# Patient Record
Sex: Female | Born: 1985 | Race: Black or African American | Hispanic: No | Marital: Single | Smoking: Never smoker
Health system: Southern US, Community
[De-identification: ages and names within clinical notes are randomized; demographics above are authoritative.]

## PROBLEM LIST (undated history)

## (undated) DIAGNOSIS — N83202 Unspecified ovarian cyst, left side: Secondary | ICD-10-CM

## (undated) DIAGNOSIS — N920 Excessive and frequent menstruation with regular cycle: Secondary | ICD-10-CM

## (undated) DIAGNOSIS — N83201 Unspecified ovarian cyst, right side: Secondary | ICD-10-CM

## (undated) DIAGNOSIS — N390 Urinary tract infection, site not specified: Secondary | ICD-10-CM

## (undated) DIAGNOSIS — D649 Anemia, unspecified: Secondary | ICD-10-CM

## (undated) DIAGNOSIS — G43909 Migraine, unspecified, not intractable, without status migrainosus: Secondary | ICD-10-CM

## (undated) DIAGNOSIS — A599 Trichomoniasis, unspecified: Secondary | ICD-10-CM

## (undated) DIAGNOSIS — L309 Dermatitis, unspecified: Secondary | ICD-10-CM

## (undated) HISTORY — PX: TOOTH EXTRACTION: SUR596

## (undated) HISTORY — PX: INDUCED ABORTION: SHX677

---
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
## 2003-03-05 ENCOUNTER — Emergency Department (HOSPITAL_COMMUNITY): Admission: AD | Admit: 2003-03-05 | Discharge: 2003-03-05 | Payer: Self-pay | Admitting: Family Medicine

## 2003-03-24 ENCOUNTER — Emergency Department (HOSPITAL_COMMUNITY): Admission: AD | Admit: 2003-03-24 | Discharge: 2003-03-24 | Payer: Self-pay | Admitting: Family Medicine

## 2004-03-01 ENCOUNTER — Emergency Department (HOSPITAL_COMMUNITY): Admission: EM | Admit: 2004-03-01 | Discharge: 2004-03-02 | Payer: Self-pay | Admitting: Emergency Medicine

## 2007-04-20 ENCOUNTER — Inpatient Hospital Stay (HOSPITAL_COMMUNITY): Admission: AD | Admit: 2007-04-20 | Discharge: 2007-04-22 | Payer: Self-pay | Admitting: Obstetrics & Gynecology

## 2010-02-03 ENCOUNTER — Emergency Department (HOSPITAL_COMMUNITY): Admission: EM | Admit: 2010-02-03 | Discharge: 2010-02-04 | Payer: Self-pay | Admitting: Emergency Medicine

## 2010-06-05 ENCOUNTER — Emergency Department (HOSPITAL_COMMUNITY)
Admission: EM | Admit: 2010-06-05 | Discharge: 2010-06-05 | Disposition: A | Discharge status: Home / Self Care | Payer: Medicaid Other | Attending: Emergency Medicine | Admitting: Emergency Medicine

## 2010-06-05 DIAGNOSIS — H5789 Other specified disorders of eye and adnexa: Secondary | ICD-10-CM | POA: Insufficient documentation

## 2010-06-05 DIAGNOSIS — H0019 Chalazion unspecified eye, unspecified eyelid: Secondary | ICD-10-CM | POA: Insufficient documentation

## 2010-07-06 LAB — URINALYSIS, ROUTINE W REFLEX MICROSCOPIC
Glucose, UA: NEGATIVE mg/dL
Hgb urine dipstick: NEGATIVE
Nitrite: NEGATIVE
Protein, ur: NEGATIVE mg/dL
Specific Gravity, Urine: 1.033 — ABNORMAL HIGH (ref 1.005–1.030)
Urobilinogen, UA: 1 mg/dL (ref 0.0–1.0)
pH: 7 (ref 5.0–8.0)

## 2010-07-06 LAB — URINE MICROSCOPIC-ADD ON

## 2010-07-06 LAB — GC/CHLAMYDIA PROBE AMP, GENITAL: GC Probe Amp, Genital: NEGATIVE

## 2010-07-06 LAB — POCT PREGNANCY, URINE: Preg Test, Ur: NEGATIVE

## 2010-07-06 LAB — WET PREP, GENITAL: Yeast Wet Prep HPF POC: NONE SEEN

## 2010-11-30 ENCOUNTER — Emergency Department (HOSPITAL_COMMUNITY)
Admission: EM | Admit: 2010-11-30 | Discharge: 2010-11-30 | Disposition: A | Discharge status: Home / Self Care | Payer: Medicaid Other | Attending: Emergency Medicine | Admitting: Emergency Medicine

## 2010-11-30 DIAGNOSIS — M549 Dorsalgia, unspecified: Secondary | ICD-10-CM | POA: Insufficient documentation

## 2010-11-30 DIAGNOSIS — N390 Urinary tract infection, site not specified: Secondary | ICD-10-CM | POA: Insufficient documentation

## 2010-11-30 DIAGNOSIS — R300 Dysuria: Secondary | ICD-10-CM | POA: Insufficient documentation

## 2010-11-30 DIAGNOSIS — R10819 Abdominal tenderness, unspecified site: Secondary | ICD-10-CM | POA: Insufficient documentation

## 2010-11-30 DIAGNOSIS — R350 Frequency of micturition: Secondary | ICD-10-CM | POA: Insufficient documentation

## 2010-11-30 LAB — URINE MICROSCOPIC-ADD ON

## 2010-11-30 LAB — URINALYSIS, ROUTINE W REFLEX MICROSCOPIC
Glucose, UA: NEGATIVE mg/dL
Protein, ur: NEGATIVE mg/dL
Specific Gravity, Urine: 1.017 (ref 1.005–1.030)
pH: 6 (ref 5.0–8.0)

## 2010-12-29 ENCOUNTER — Other Ambulatory Visit: Payer: Self-pay | Admitting: Obstetrics

## 2010-12-29 DIAGNOSIS — N63 Unspecified lump in unspecified breast: Secondary | ICD-10-CM

## 2010-12-31 ENCOUNTER — Inpatient Hospital Stay (HOSPITAL_COMMUNITY)
Admission: AD | Admit: 2010-12-31 | Discharge: 2011-01-01 | Disposition: A | Discharge status: Home / Self Care | Payer: Medicaid Other | Source: Ambulatory Visit | Attending: Obstetrics & Gynecology | Admitting: Obstetrics & Gynecology

## 2010-12-31 DIAGNOSIS — R300 Dysuria: Secondary | ICD-10-CM | POA: Insufficient documentation

## 2010-12-31 HISTORY — DX: Anemia, unspecified: D64.9

## 2011-01-01 ENCOUNTER — Encounter (HOSPITAL_COMMUNITY): Payer: Self-pay | Admitting: *Deleted

## 2011-01-01 LAB — URINALYSIS, ROUTINE W REFLEX MICROSCOPIC
Bilirubin Urine: NEGATIVE
Hgb urine dipstick: NEGATIVE
Specific Gravity, Urine: >1.03 — ABNORMAL HIGH (ref 1.005–1.030)
Urobilinogen, UA: 1 mg/dL (ref 0.0–1.0)

## 2011-01-01 MED ORDER — PHENAZOPYRIDINE HCL 200 MG PO TABS: 200.0000 mg | ORAL_TABLET | Freq: Three times a day (TID) | ORAL | Status: AC | PRN

## 2011-01-01 MED ORDER — PHENAZOPYRIDINE HCL 100 MG PO TABS
200.0000 mg | ORAL_TABLET | Freq: Once | ORAL | Status: AC
  Administered 2011-01-01: 200 mg via ORAL
  Filled 2011-01-01: qty 2

## 2011-01-01 NOTE — Progress Notes (Signed)
When voids has bladder cramping that is level 7 with pain

## 2011-01-01 NOTE — ED Notes (Signed)
Magda Kiel CNM in to see pt

## 2011-01-01 NOTE — Progress Notes (Signed)
Written and verbal d/c instructions given and understanding voiced. 

## 2011-01-01 NOTE — Progress Notes (Signed)
G6P1 TABx 5 LMP 12/04/10. Treated for uti at [facility] about 1 month ago. Felt better for awhile but now having same symptoms. Frequent urination with severe cramping right after voids. Voids small amts. R flank pain and nausea. Late with period and wondering if could be pregnant.

## 2011-01-01 NOTE — ED Provider Notes (Signed)
Chief Complaint:  Abdominal Pain and Dysuria   Chelsea Reed is  25 y.o. [phone].  Patient's last menstrual period was 12/04/2010.Marland Kitchen  Her pregnancy status is negative.  She presents complaining of Abdominal Pain and Dysuria . Onset is described as gradual and has been present for  1 days. Reports lower abd cramping with urination, frequency, oliguria, and right-sided low back pain. Denies fever, chills, nausea, vomiting, vag blding or discharge  Obstetrical/Gynecological History: OB History    Grav Para Term Preterm Abortions TAB SAB Ect Mult Living   6 1 1  0 5 5 0 0 0 1      Past Medical History: Past Medical History  Diagnosis Date  . Anemia     Past Surgical History: Past Surgical History  Procedure Date  . Induced abortion     TABs x 5    Family History: No family history on file.  Social History: History  Substance Use Topics  . Smoking status: Never Smoker   . Smokeless tobacco: Not on file  . Alcohol Use: Yes     social drinker    Allergies: No Known Allergies  Prescriptions prior to admission  Medication Sig Dispense Refill  . ibuprofen (ADVIL,MOTRIN) 200 MG tablet Take 200 mg by mouth every 6 (six) hours as needed. For menstrual pain and cramps       . Multiple Vitamin (MULTIVITAMIN PO) Take 1 tablet by mouth daily.          Review of Systems - Negative except what has been reviewed in the HPI  Physical Exam   Blood pressure 126/79, pulse 71, temperature 98.7 F (37.1 C), resp. rate 20, height 5\' 5"  (1.651 m), weight 66.588 kg (146 lb 12.8 oz), last menstrual period 12/04/2010.  General: General appearance - alert, well appearing, and in no distress, oriented to person, place, and time and overweight Mental status - normal mood, behavior, speech, dress, motor activity, and thought processes, affect appropriate to mood Lymphatics - no palpable lymphadenopathy, no hepatosplenomegaly Abdomen - soft, nontender, nondistended, no masses or organomegaly no  rebound tenderness noted bowel sounds normal no bladder distension noted no CVA tenderness Focused Gynecological Exam: examination not indicated  Labs: Recent Results (from the past 24 hour(s))  URINALYSIS, ROUTINE W REFLEX MICROSCOPIC   Collection Time   01/01/11 12:50 AM      Component Value Range   Color, Urine YELLOW  YELLOW    Appearance CLEAR  CLEAR    Specific Gravity, Urine >1.030 (*) 1.005 - 1.030    pH 6.0  5.0 - 8.0    Glucose, UA NEGATIVE  NEGATIVE (mg/dL)   Hgb urine dipstick NEGATIVE  NEGATIVE    Bilirubin Urine NEGATIVE  NEGATIVE    Ketones, ur NEGATIVE  NEGATIVE (mg/dL)   Protein, ur NEGATIVE  NEGATIVE (mg/dL)   Urobilinogen, UA 1.0  0.0 - 1.0 (mg/dL)   Nitrite NEGATIVE  NEGATIVE    Leukocytes, UA NEGATIVE  NEGATIVE   POCT PREGNANCY, URINE   Collection Time   01/01/11  1:00 AM      Component Value Range   Preg Test, Ur NEGATIVE      Assessment: Dysuria  Plan: Discharge home Urine Culture Sent Rx pyridium prn  Dionisio Aragones E. 01/01/2011,1:33 AM

## 2011-01-01 NOTE — ED Notes (Signed)
[address] CNM in to discuss u/a results and plan of care for d/c home

## 2011-01-05 ENCOUNTER — Ambulatory Visit
Admission: RE | Admit: 2011-01-05 | Discharge: 2011-01-05 | Disposition: A | Discharge status: Home / Self Care | Payer: Medicaid Other | Source: Ambulatory Visit | Attending: Obstetrics | Admitting: Obstetrics

## 2011-01-05 DIAGNOSIS — N63 Unspecified lump in unspecified breast: Secondary | ICD-10-CM

## 2011-01-27 LAB — CBC
HCT: 35 — ABNORMAL LOW
MCV: 89.7
MCV: 89.8
Platelets: 184
Platelets: 188
RBC: 3.28 — ABNORMAL LOW
RBC: 3.9
WBC: 10.9 — ABNORMAL HIGH
WBC: 13.1 — ABNORMAL HIGH

## 2011-01-27 LAB — RPR: RPR Ser Ql: NONREACTIVE

## 2011-04-08 ENCOUNTER — Encounter (HOSPITAL_COMMUNITY): Payer: Self-pay | Admitting: *Deleted

## 2011-04-08 ENCOUNTER — Inpatient Hospital Stay (HOSPITAL_COMMUNITY)
Admission: AD | Admit: 2011-04-08 | Discharge: 2011-04-08 | Disposition: A | Discharge status: Home / Self Care | Payer: Medicaid Other | Source: Ambulatory Visit | Attending: Obstetrics | Admitting: Obstetrics

## 2011-04-08 DIAGNOSIS — N949 Unspecified condition associated with female genital organs and menstrual cycle: Secondary | ICD-10-CM | POA: Insufficient documentation

## 2011-04-08 DIAGNOSIS — N938 Other specified abnormal uterine and vaginal bleeding: Secondary | ICD-10-CM | POA: Insufficient documentation

## 2011-04-08 DIAGNOSIS — Z3202 Encounter for pregnancy test, result negative: Secondary | ICD-10-CM

## 2011-04-08 LAB — HCG, SERUM, QUALITATIVE: Preg, Serum: NEGATIVE

## 2011-04-08 LAB — URINALYSIS, ROUTINE W REFLEX MICROSCOPIC
Glucose, UA: NEGATIVE mg/dL
Ketones, ur: NEGATIVE mg/dL
Leukocytes, UA: NEGATIVE
Nitrite: NEGATIVE
Specific Gravity, Urine: >1.03 — ABNORMAL HIGH (ref 1.005–1.030)
pH: 5.5 (ref 5.0–8.0)

## 2011-04-08 LAB — URINE MICROSCOPIC-ADD ON

## 2011-04-08 LAB — POCT PREGNANCY, URINE: Preg Test, Ur: NEGATIVE

## 2011-04-08 NOTE — ED Provider Notes (Signed)
History     Chief Complaint  Patient presents with  . Vaginal Bleeding   HPI 25 y.o. [id number] with light bleeding, states she expected her period to come last week, took an HPT, faint positive, had another faint positive on 2 days ago. No pain. Patient's last menstrual period was 03/02/2011.    Past Medical History  Diagnosis Date  . Anemia     Past Surgical History  Procedure Date  . Induced abortion     TABs x 5    History reviewed. No pertinent family history.  History  Substance Use Topics  . Smoking status: Never Smoker   . Smokeless tobacco: Not on file  . Alcohol Use: Yes     social drinker    Allergies: No Known Allergies  No prescriptions prior to admission    Review of Systems  Constitutional: Negative.   Respiratory: Negative.   Cardiovascular: Negative.   Gastrointestinal: Negative for nausea, vomiting, abdominal pain, diarrhea and constipation.  Genitourinary: Negative for dysuria, urgency, frequency, hematuria and flank pain.       Positive for vaginal bleeding   Musculoskeletal: Negative.   Neurological: Negative.   Psychiatric/Behavioral: Negative.    Physical Exam   Blood pressure 124/70, pulse 70, temperature 98.9 F (37.2 C), temperature source Oral, resp. rate 20, height 5\' 4"  (1.626 m), weight 146 lb 6.4 oz (66.407 kg), last menstrual period 03/02/2011.  Physical Exam  Nursing note and vitals reviewed. Constitutional: She is oriented to person, place, and time. She appears well-developed and well-nourished. No distress.  Cardiovascular: Normal rate.   Respiratory: Effort normal.  GI: Soft. There is no tenderness.  Musculoskeletal: Normal range of motion.  Neurological: She is alert and oriented to person, place, and time.  Skin: Skin is warm and dry.  Psychiatric: She has a normal mood and affect.    MAU Course  Procedures  Results for orders placed during the hospital encounter of 04/08/11 (from the past 24 hour(s))    URINALYSIS, ROUTINE W REFLEX MICROSCOPIC     Status: Abnormal   Collection Time   04/08/11 11:50 AM      Component Value Range   Color, Urine YELLOW  YELLOW    APPearance HAZY (*) CLEAR    Specific Gravity, Urine >1.030 (*) 1.005 - 1.030    pH 5.5  5.0 - 8.0    Glucose, UA NEGATIVE  NEGATIVE (mg/dL)   Hgb urine dipstick LARGE (*) NEGATIVE    Bilirubin Urine NEGATIVE  NEGATIVE    Ketones, ur NEGATIVE  NEGATIVE (mg/dL)   Protein, ur NEGATIVE  NEGATIVE (mg/dL)   Urobilinogen, UA 0.2  0.0 - 1.0 (mg/dL)   Nitrite NEGATIVE  NEGATIVE    Leukocytes, UA NEGATIVE  NEGATIVE   URINE MICROSCOPIC-ADD ON     Status: Abnormal   Collection Time   04/08/11 11:50 AM      Component Value Range   Squamous Epithelial / LPF FEW (*) RARE    WBC, UA 0-2  <3 (WBC/hpf)   RBC / HPF 21-50  <3 (RBC/hpf)   Bacteria, UA RARE  RARE   POCT PREGNANCY, URINE     Status: Normal   Collection Time   04/08/11 11:55 AM      Component Value Range   Preg Test, Ur NEGATIVE    HCG, SERUM, QUALITATIVE     Status: Normal   Collection Time   04/08/11 12:11 PM      Component Value Range   Preg, Serum NEGATIVE  NEGATIVE      Assessment and Plan  25 y.o. [id number] at with negative pregnancy test, bleeding likely due to normal menstruation, follow up PRN  Chanay Nugent 04/08/2011, 2:50 PM

## 2011-04-08 NOTE — Progress Notes (Signed)
Pt reports having +HPT started having bleeding and spotting since Thursday. Pt feels weak and nauseated.

## 2011-07-03 ENCOUNTER — Inpatient Hospital Stay (HOSPITAL_COMMUNITY)
Admission: AD | Admit: 2011-07-03 | Discharge: 2011-07-03 | Disposition: A | Discharge status: Home / Self Care | Payer: Medicaid Other | Source: Ambulatory Visit | Attending: Obstetrics | Admitting: Obstetrics

## 2011-07-03 ENCOUNTER — Encounter (HOSPITAL_COMMUNITY): Payer: Self-pay | Admitting: *Deleted

## 2011-07-03 DIAGNOSIS — R300 Dysuria: Secondary | ICD-10-CM

## 2011-07-03 HISTORY — DX: Trichomoniasis, unspecified: A59.9

## 2011-07-03 HISTORY — DX: Urinary tract infection, site not specified: N39.0

## 2011-07-03 HISTORY — DX: Unspecified ovarian cyst, left side: N83.202

## 2011-07-03 HISTORY — DX: Unspecified ovarian cyst, right side: N83.201

## 2011-07-03 HISTORY — DX: Excessive and frequent menstruation with regular cycle: N92.0

## 2011-07-03 LAB — URINE MICROSCOPIC-ADD ON

## 2011-07-03 LAB — POCT PREGNANCY, URINE: Preg Test, Ur: NEGATIVE

## 2011-07-03 LAB — URINALYSIS, ROUTINE W REFLEX MICROSCOPIC
Glucose, UA: NEGATIVE mg/dL
Ketones, ur: NEGATIVE mg/dL
Protein, ur: NEGATIVE mg/dL
pH: 6 (ref 5.0–8.0)

## 2011-07-03 MED ORDER — CIPROFLOXACIN HCL 250 MG PO TABS: 250.0000 mg | ORAL_TABLET | Freq: Two times a day (BID) | ORAL | Status: AC

## 2011-07-03 NOTE — MAU Note (Signed)
Patient states she has a history of a severe UTI about one year ago. Started having the same symptoms about one week ago, pain and burning and frequency with urination. Started having left flank pain last night.

## 2011-07-03 NOTE — MAU Provider Note (Signed)
History     Chief Complaint  Patient presents with  . Dysuria   HPI Chelsea Reed is 26 y.o. [id number] presents with symptoms of dysuria for 7 days.  Bought cranberry pills which has helped her sxs.   Menses began 6 days ago.  Continues forcing po fluids.  Continues with strong urinary odor, frequency.  Began yesterday with left lower back pain.  Hx of "severe" UTI one year and feels the same sxs.  Didn't want it to get bad.  Denies fever, chills.   Called Dr. Elsie Reed office 1 week ago and made appt for this Wednesday.  Wants to keep that appointment with him to have STD check, ? Pap smear and follow up for UTI.  Is at the end of her period now.     Past Medical History  Diagnosis Date  . Anemia   . Heavy periods   . Bilateral ovarian cysts     resolve without intervention  . Trichomonas   . Urinary tract infection     Past Surgical History  Procedure Date  . Induced abortion     TABs x 5  . Tooth extraction     History reviewed. No pertinent family history.  History  Substance Use Topics  . Smoking status: Never Smoker   . Smokeless tobacco: Never Used  . Alcohol Use: Yes     social drinker    Allergies: No Known Allergies  Prescriptions prior to admission  Medication Sig Dispense Refill  . CRANBERRY EXTRACT PO Take 1 tablet by mouth daily as needed. For uti symptoms        Review of Systems  Constitutional: Negative for fever, chills and malaise/fatigue.  Genitourinary: Positive for dysuria. Negative for urgency, frequency and hematuria.  Musculoskeletal: Positive for back pain (low).  Psychiatric/Behavioral: The patient is not nervous/anxious.    Physical Exam   Blood pressure 111/83, pulse 68, temperature 97.9 F (36.6 C), temperature source Oral, resp. rate 16, height 5' 3.5" (1.613 m), weight 64.955 kg (143 lb 3.2 oz), last menstrual period 06/27/2011, SpO2 100.00%.  Physical Exam  Constitutional: She is oriented to person, place, and time. She  appears well-developed and well-nourished. No distress.  HENT:  Head: Normocephalic.  Neck: Normal range of motion.  Cardiovascular: Normal rate.   Respiratory: Effort normal.  GI: There is no tenderness. There is no CVA tenderness.  Genitourinary:       Pelvic exam not done  Musculoskeletal:       Right shoulder: She exhibits tenderness (lower left).  Neurological: She is alert and oriented to person, place, and time.  Skin: Skin is warm and dry.  Psychiatric: She has a normal mood and affect. Her behavior is normal.   Results for orders placed during the hospital encounter of 07/03/11 (from the past 24 hour(s))  URINALYSIS, ROUTINE W REFLEX MICROSCOPIC     Status: Abnormal   Collection Time   07/03/11 11:15 AM      Component Value Range   Color, Urine YELLOW  YELLOW    APPearance HAZY (*) CLEAR    Specific Gravity, Urine 1.025  1.005 - 1.030    pH 6.0  5.0 - 8.0    Glucose, UA NEGATIVE  NEGATIVE (mg/dL)   Hgb urine dipstick LARGE (*) NEGATIVE    Bilirubin Urine NEGATIVE  NEGATIVE    Ketones, ur NEGATIVE  NEGATIVE (mg/dL)   Protein, ur NEGATIVE  NEGATIVE (mg/dL)   Urobilinogen, UA 0.2  0.0 - 1.0 (mg/dL)  Nitrite NEGATIVE  NEGATIVE    Leukocytes, UA MODERATE (*) NEGATIVE   URINE MICROSCOPIC-ADD ON     Status: Abnormal   Collection Time   07/03/11 11:15 AM      Component Value Range   Squamous Epithelial / LPF RARE  RARE    WBC, UA 11-20  <3 (WBC/hpf)   Bacteria, UA FEW (*) RARE    Urine-Other MUCOUS PRESENT    POCT PREGNANCY, URINE     Status: Normal   Collection Time   07/03/11 11:22 AM      Component Value Range   Preg Test, Ur NEGATIVE  NEGATIVE    MAU Course  Procedures    MDM   Assessment and Plan  A:  Dysuria       P:  Cipro 250mg  po bid X 3 days       Keep appt to see Dr. Gaynell Reed for 3/13      Continue po fluids.  Chelsea Reed,EVE M 07/03/2011, 11:41 AM

## 2011-07-19 ENCOUNTER — Other Ambulatory Visit: Payer: Self-pay | Admitting: Obstetrics

## 2011-07-19 DIAGNOSIS — N63 Unspecified lump in unspecified breast: Secondary | ICD-10-CM

## 2011-08-01 ENCOUNTER — Other Ambulatory Visit: Payer: Medicaid Other

## 2011-08-14 ENCOUNTER — Inpatient Hospital Stay: Admission: RE | Admit: 2011-08-14 | Payer: Medicaid Other | Source: Ambulatory Visit

## 2011-08-24 ENCOUNTER — Ambulatory Visit
Admission: RE | Admit: 2011-08-24 | Discharge: 2011-08-24 | Disposition: A | Discharge status: Home / Self Care | Payer: Medicaid Other | Source: Ambulatory Visit | Attending: Obstetrics | Admitting: Obstetrics

## 2011-08-24 DIAGNOSIS — N63 Unspecified lump in unspecified breast: Secondary | ICD-10-CM

## 2011-09-07 ENCOUNTER — Encounter (HOSPITAL_COMMUNITY): Payer: Self-pay | Admitting: *Deleted

## 2011-09-07 ENCOUNTER — Inpatient Hospital Stay (HOSPITAL_COMMUNITY)
Admission: AD | Admit: 2011-09-07 | Discharge: 2011-09-08 | Disposition: A | Discharge status: Home / Self Care | Payer: Medicaid Other | Source: Ambulatory Visit | Attending: Obstetrics | Admitting: Obstetrics

## 2011-09-07 DIAGNOSIS — N390 Urinary tract infection, site not specified: Secondary | ICD-10-CM

## 2011-09-07 DIAGNOSIS — N83209 Unspecified ovarian cyst, unspecified side: Secondary | ICD-10-CM

## 2011-09-07 DIAGNOSIS — N83202 Unspecified ovarian cyst, left side: Secondary | ICD-10-CM

## 2011-09-07 DIAGNOSIS — R1032 Left lower quadrant pain: Secondary | ICD-10-CM | POA: Insufficient documentation

## 2011-09-07 LAB — URINALYSIS, ROUTINE W REFLEX MICROSCOPIC
Bilirubin Urine: NEGATIVE
Glucose, UA: NEGATIVE mg/dL
Hgb urine dipstick: NEGATIVE
Ketones, ur: NEGATIVE mg/dL
Protein, ur: NEGATIVE mg/dL
Urobilinogen, UA: 0.2 mg/dL (ref 0.0–1.0)

## 2011-09-07 LAB — URINE MICROSCOPIC-ADD ON

## 2011-09-07 NOTE — MAU Note (Signed)
Pt LMP 08/22/2011, ago started having sharp LLQ pain, felt like needed to have a BM, passed small amt of stool, pain became more intense.  Pain has eased at this time.  Hx ovarian cyst.

## 2011-09-08 ENCOUNTER — Inpatient Hospital Stay (HOSPITAL_COMMUNITY): Payer: Medicaid Other

## 2011-09-08 LAB — CBC
HCT: 32.2 % — ABNORMAL LOW (ref 36.0–46.0)
MCV: 87.3 fL (ref 78.0–100.0)
RBC: 3.69 MIL/uL — ABNORMAL LOW (ref 3.87–5.11)
RDW: 14.5 % (ref 11.5–15.5)
WBC: 7.1 10*3/uL (ref 4.0–10.5)

## 2011-09-08 LAB — DIFFERENTIAL
Basophils Absolute: 0 10*3/uL (ref 0.0–0.1)
Eosinophils Relative: 3 % (ref 0–5)
Lymphocytes Relative: 31 % (ref 12–46)
Lymphs Abs: 2.2 10*3/uL (ref 0.7–4.0)
Monocytes Absolute: 0.6 10*3/uL (ref 0.1–1.0)
Neutro Abs: 4.1 10*3/uL (ref 1.7–7.7)

## 2011-09-08 LAB — POCT PREGNANCY, URINE: Preg Test, Ur: NEGATIVE

## 2011-09-08 MED ORDER — SULFAMETHOXAZOLE-TRIMETHOPRIM 800-160 MG PO TABS: 1.0000 | ORAL_TABLET | Freq: Two times a day (BID) | ORAL | Status: DC

## 2011-09-08 MED ORDER — KETOROLAC TROMETHAMINE 60 MG/2ML IM SOLN
60.0000 mg | Freq: Once | INTRAMUSCULAR | Status: AC
  Administered 2011-09-08: 60 mg via INTRAMUSCULAR
  Filled 2011-09-08: qty 2

## 2011-09-08 MED ORDER — SULFAMETHOXAZOLE-TRIMETHOPRIM 800-160 MG PO TABS: 1.0000 | ORAL_TABLET | Freq: Two times a day (BID) | ORAL | Status: AC

## 2011-09-08 NOTE — Discharge Instructions (Signed)
Ovarian Cyst The ovaries are small organs that are on each side of the uterus. The ovaries are the organs that produce the female hormones, estrogen and progesterone. An ovarian cyst is a sac filled with fluid that can vary in its size. It is normal for a small cyst to form in women who are in the childbearing age and who have menstrual periods. This type of cyst is called a follicle cyst that becomes an ovulation cyst (corpus luteum cyst) after it produces the women's egg. It later goes away on its own if the woman does not become pregnant. There are other kinds of ovarian cysts that may cause problems and may need to be treated. The most serious problem is a cyst with cancer. It should be noted that menopausal women who have an ovarian cyst are at a higher risk of it being a cancer cyst. They should be evaluated very quickly, thoroughly and followed closely. This is especially true in menopausal women because of the high rate of ovarian cancer in women in menopause. CAUSES AND TYPES OF OVARIAN CYSTS:  FUNCTIONAL CYST: The follicle/corpus luteum cyst is a functional cyst that occurs every month during ovulation with the menstrual cycle. They go away with the next menstrual cycle if the woman does not get pregnant. Usually, there are no symptoms with a functional cyst.   ENDOMETRIOMA CYST: This cyst develops from the lining of the uterus tissue. This cyst gets in or on the ovary. It grows every month from the bleeding during the menstrual period. It is also called a "chocolate cyst" because it becomes filled with blood that turns brown. This cyst can cause pain in the lower abdomen during intercourse and with your menstrual period.   CYSTADENOMA CYST: This cyst develops from the cells on the outside of the ovary. They usually are not cancerous. They can get very big and cause lower abdomen pain and pain with intercourse. This type of cyst can twist on itself, cut off its blood supply and cause severe pain.  It also can easily rupture and cause a lot of pain.   DERMOID CYST: This type of cyst is sometimes found in both ovaries. They are found to have different kinds of body tissue in the cyst. The tissue includes skin, teeth, hair, and/or cartilage. They usually do not have symptoms unless they get very big. Dermoid cysts are rarely cancerous.   POLYCYSTIC OVARY: This is a rare condition with hormone problems that produces many small cysts on both ovaries. The cysts are follicle-like cysts that never produce an egg and become a corpus luteum. It can cause an increase in body weight, infertility, acne, increase in body and facial hair and lack of menstrual periods or rare menstrual periods. Many women with this problem develop type 2 diabetes. The exact cause of this problem is unknown. A polycystic ovary is rarely cancerous.   THECA LUTEIN CYST: Occurs when too much hormone (human chorionic gonadotropin) is produced and over-stimulates the ovaries to produce an egg. They are frequently seen when doctors stimulate the ovaries for invitro-fertilization (test tube babies).   LUTEOMA CYST: This cyst is seen during pregnancy. Rarely it can cause an obstruction to the birth canal during labor and delivery. They usually go away after delivery.  SYMPTOMS   Pelvic pain or pressure.   Pain during sexual intercourse.   Increasing girth (swelling) of the abdomen.   Abnormal menstrual periods.   Increasing pain with menstrual periods.   You stop having   You stop having menstrual periods and you are not pregnant.  DIAGNOSIS   The diagnosis can be made during:   Routine or annual pelvic examination (common).   Ultrasound.   X-ray of the pelvis.   CT Scan.   MRI.   Blood tests.  TREATMENT    Treatment may only be to follow the cyst monthly for 2 to 3 months with your caregiver. Many go away on their own, especially functional cysts.   May be aspirated (drained) with a long needle with ultrasound, or by laparoscopy (inserting a tube into  the pelvis through a small incision).   The whole cyst can be removed by laparoscopy.   Sometimes the cyst may need to be removed through an incision in the lower abdomen.   Hormone treatment is sometimes used to help dissolve certain cysts.   Birth control pills are sometimes used to help dissolve certain cysts.  HOME CARE INSTRUCTIONS   Follow your caregiver's advice regarding:   Medicine.   Follow up visits to evaluate and treat the cyst.   You may need to come back or make an appointment with another caregiver, to find the exact cause of your cyst, if your caregiver is not a gynecologist.   Get your yearly and recommended pelvic examinations and Pap tests.   Let your caregiver know if you have had an ovarian cyst in the past.  SEEK MEDICAL CARE IF:    Your periods are late, irregular, they stop, or are painful.   Your stomach (abdomen) or pelvic pain does not go away.   Your stomach becomes larger or swollen.   You have pressure on your bladder or trouble emptying your bladder completely.   You have painful sexual intercourse.   You have feelings of fullness, pressure, or discomfort in your stomach.   You lose weight for no apparent reason.   You feel generally ill.   You become constipated.   You lose your appetite.   You develop acne.   You have an increase in body and facial hair.   You are gaining weight, without changing your exercise and eating habits.   You think you are pregnant.  SEEK IMMEDIATE MEDICAL CARE IF:    You have increasing abdominal pain.   You feel sick to your stomach (nausea) and/or vomit.   You develop a fever that comes on suddenly.   You develop abdominal pain during a bowel movement.   Your menstrual periods become heavier than usual.  Document Released: 04/10/2005 Document Revised: 03/30/2011 Document Reviewed: 02/11/2009  [employer] Patient Information 2012 [employer]    Urinary Tract Infection  Infections of the urinary tract can start in several  places. A bladder infection (cystitis), a kidney infection (pyelonephritis), and a prostate infection (prostatitis) are different types of urinary tract infections (UTIs). They usually get better if treated with medicines (antibiotics) that kill germs. Take all the medicine until it is gone. You or your child may feel better in a few days, but TAKE ALL MEDICINE or the infection may not respond and may become more difficult to treat.  HOME CARE INSTRUCTIONS    Drink enough water and fluids to keep the urine clear or pale yellow. Cranberry juice is especially recommended, in addition to large amounts of water.   Avoid caffeine, tea, and carbonated beverages. They tend to irritate the bladder.   Alcohol may irritate the prostate.   Only take over-the-counter or prescription medicines for pain, discomfort, or fever as directed   by your caregiver.  To prevent further infections:   Empty the bladder often. Avoid holding urine for long periods of time.   After a bowel movement, women should cleanse from front to back. Use each tissue only once.   Empty the bladder before and after sexual intercourse.  FINDING OUT THE RESULTS OF YOUR TEST  Not all test results are available during your visit. If your or your child's test results are not back during the visit, make an appointment with your caregiver to find out the results. Do not assume everything is normal if you have not heard from your caregiver or the medical facility. It is important for you to follow up on all test results.  SEEK MEDICAL CARE IF:    There is back pain.   Your baby is older than 3 months with a rectal temperature of 100.5 F (38.1 C) or higher for more than 1 day.   Your or your child's problems (symptoms) are no better in 3 days. Return sooner if you or your child is getting worse.  SEEK IMMEDIATE MEDICAL CARE IF:    There is severe back pain or lower abdominal pain.   You or your child develops chills.   You have a fever.   Your baby is  older than 3 months with a rectal temperature of 102 F (38.9 C) or higher.   Your baby is 3 months old or younger with a rectal temperature of 100.4 F (38 C) or higher.   There is nausea or vomiting.   There is continued burning or discomfort with urination.  MAKE SURE YOU:    Understand these instructions.   Will watch your condition.   Will get help right away if you are not doing well or get worse.  Document Released: 01/18/2005 Document Revised: 03/30/2011 Document Reviewed: 08/23/2006  [employer] Patient Information 2012 [employer]

## 2011-09-08 NOTE — ED Provider Notes (Signed)
Patient resting comfortably. Toradol helped with the pain.

## 2011-09-08 NOTE — MAU Note (Signed)
Pt return from U/s.

## 2011-09-08 NOTE — MAU Provider Note (Signed)
History     CSN: [id number]  Arrival date and time: 09/07/11 2307   None     Chief Complaint  Patient presents with  . Abdominal Pain   HPI Patient is a 26 y/o AAF [id number] with h/o ovarian cysts. BIBA after having cramping pain in the LLQ while lying on her bed around 2300 that has now resolved. States at the time she went to the bathroom because she felt like she needed to have a BM. Her stool came out in hard pellets.While straining during her BM she became sweaty and felt as if she was going to pass-out. Denies Vaginal bleeding or d/c, SOB, CP.  LMP 08/22/11. BM typically once a day but recently QOD. LOI was a cookie at 2300.  OB History    Grav Para Term Preterm Abortions TAB SAB Ect Mult Living   6 1 1  0 5 5 0 0 0 1      Past Medical History  Diagnosis Date  . Anemia   . Heavy periods   . Bilateral ovarian cysts     resolve without intervention  . Trichomonas   . Urinary tract infection     Past Surgical History  Procedure Date  . Induced abortion     TABs x 5  . Tooth extraction     History reviewed. No pertinent family history.  History  Substance Use Topics  . Smoking status: Never Smoker   . Smokeless tobacco: Never Used  . Alcohol Use: Yes     social drinker    Allergies: No Known Allergies  Prescriptions prior to admission  Medication Sig Dispense Refill  . Multiple Vitamin (MULITIVITAMIN WITH MINERALS) TABS Take 1 tablet by mouth every morning.        Review of Systems  Constitutional: Positive for diaphoresis. Negative for fever and chills.  Gastrointestinal: Positive for abdominal pain and constipation. Negative for blood in stool and melena.  Genitourinary: Negative for dysuria, urgency, frequency, hematuria and flank pain.  Neurological: Negative for loss of consciousness.   Physical Exam   Blood pressure 90/55, pulse 79, temperature 97.6 F (36.4 C), temperature source Oral, resp. rate 16, height 5\' 4"  (1.626 m), weight 65.772 kg (145  lb), last menstrual period 08/22/2011.  Physical Exam  Constitutional: She appears well-developed and well-nourished. No distress.  HENT:  Head: Normocephalic and atraumatic.  Cardiovascular: Normal rate, regular rhythm, normal heart sounds and intact distal pulses.  Exam reveals no gallop and no friction rub.   No murmur heard. Respiratory: Effort normal and breath sounds normal. No respiratory distress. She has no wheezes. She has no rales. She exhibits no tenderness.  GI: Soft. Normal appearance. She exhibits no shifting dullness, no distension, no pulsatile liver, no abdominal bruit, no ascites and no mass. Bowel sounds are increased. There is no hepatosplenomegaly, splenomegaly or hepatomegaly. There is tenderness in the left lower quadrant. There is no rebound, no guarding, no tenderness at McBurney's point and negative Murphy's sign. No hernia. Hernia confirmed negative in the ventral area.    Genitourinary: Rectum normal, vagina normal and uterus normal. Rectal exam shows no external hemorrhoid, no internal hemorrhoid, no fissure, no mass, no tenderness and anal tone normal. No labial fusion. There is no rash, tenderness, lesion or injury on the right labia. There is no rash, tenderness, lesion or injury on the left labia. Uterus is not enlarged. Cervix exhibits no motion tenderness, no discharge and no friability. Right adnexum displays mass. Left adnexum displays tenderness. Left  adnexum displays no mass and no fullness. No erythema or bleeding around the vagina. No foreign body around the vagina. No vaginal discharge found.       Mass- ovary?   Skin: She is not diaphoretic.   Results for orders placed during the hospital encounter of 09/07/11 (from the past 24 hour(s))  URINALYSIS, ROUTINE W REFLEX MICROSCOPIC     Status: Abnormal   Collection Time   09/07/11 11:20 PM      Component Value Range   Color, Urine YELLOW  YELLOW    APPearance HAZY (*) CLEAR    Specific Gravity, Urine  >1.030 (*) 1.005 - 1.030    pH 6.0  5.0 - 8.0    Glucose, UA NEGATIVE  NEGATIVE (mg/dL)   Hgb urine dipstick NEGATIVE  NEGATIVE    Bilirubin Urine NEGATIVE  NEGATIVE    Ketones, ur NEGATIVE  NEGATIVE (mg/dL)   Protein, ur NEGATIVE  NEGATIVE (mg/dL)   Urobilinogen, UA 0.2  0.0 - 1.0 (mg/dL)   Nitrite NEGATIVE  NEGATIVE    Leukocytes, UA SMALL (*) NEGATIVE   URINE MICROSCOPIC-ADD ON     Status: Abnormal   Collection Time   09/07/11 11:20 PM      Component Value Range   Squamous Epithelial / LPF MANY (*) RARE    WBC, UA 21-50  <3 (WBC/hpf)   Bacteria, UA MANY (*) RARE    Urine-Other MUCOUS PRESENT    CBC     Status: Abnormal   Collection Time   09/07/11 11:45 PM      Component Value Range   WBC 7.1  4.0 - 10.5 (K/uL)   RBC 3.69 (*) 3.87 - 5.11 (MIL/uL)   Hemoglobin 10.2 (*) 12.0 - 15.0 (g/dL)   HCT 47.8 (*) 29.5 - 46.0 (%)   MCV 87.3  78.0 - 100.0 (fL)   MCH 27.6  26.0 - 34.0 (pg)   MCHC 31.7  30.0 - 36.0 (g/dL)   RDW 62.1  30.8 - 65.7 (%)   Platelets 233  150 - 400 (K/uL)  DIFFERENTIAL     Status: Normal   Collection Time   09/07/11 11:45 PM      Component Value Range   Neutrophils Relative 58  43 - 77 (%)   Neutro Abs 4.1  1.7 - 7.7 (K/uL)   Lymphocytes Relative 31  12 - 46 (%)   Lymphs Abs 2.2  0.7 - 4.0 (K/uL)   Monocytes Relative 8  3 - 12 (%)   Monocytes Absolute 0.6  0.1 - 1.0 (K/uL)   Eosinophils Relative 3  0 - 5 (%)   Eosinophils Absolute 0.2  0.0 - 0.7 (K/uL)   Basophils Relative 0  0 - 1 (%)   Basophils Absolute 0.0  0.0 - 0.1 (K/uL)  US Transvaginal Non-ob  09/08/2011  *RADIOLOGY REPORT*  Clinical Data: Left pelvic pain.  History of ovarian cysts.  TRANSABDOMINAL AND TRANSVAGINAL ULTRASOUND OF PELVIS Technique:  Both transabdominal and transvaginal ultrasound examinations of the pelvis were performed. Transabdominal technique was performed for global imaging of the pelvis including uterus, ovaries, adnexal regions, and pelvic cul-de-sac.  Comparison: None.   It  was necessary to proceed with endovaginal exam following the transabdominal exam to visualize the uterus, ovaries, and endometrium.  Findings:  Uterus: The uterus measures 10.3 x 5.1 x 6.2 cm.  Heterogeneous myometrial echotexture with focal hypoechoic myometrial lesion measuring about 1.5 cm diameter and located in the posterior left fundus consistent with submucosal fibroid.Small subserosal  fibroid measuring 1 x 1.4 cm in the right fundus.Nabothian cysts in the cervix.  Endometrium: Endometrial stripe thickness measures about 14 mm. Homogeneous appearance.  No endometrial fluid.  Right ovary:  Right ovary measures 4.2 x 2.7 x 2.7 cm.  Normal follicular changes.  Left ovary: Left ovary measures 3.7 x 1.8 x 2.1 cm.  Normal follicular changes.  Other findings: There is a moderate amount of free fluid noted in the pelvis extending around the left adnexal region.  Flow is demonstrated within both ovaries on color flow Doppler imaging.  IMPRESSION: Small uterine fibroids.  Ovaries and endometrium are unremarkable. Moderate free fluid in the left pelvis.  Original Report Authenticated By: Marlon Pel, M.D.   US Pelvis Complete  09/08/2011  *RADIOLOGY REPORT*  Clinical Data: Left pelvic pain.  History of ovarian cysts.  TRANSABDOMINAL AND TRANSVAGINAL ULTRASOUND OF PELVIS Technique:  Both transabdominal and transvaginal ultrasound examinations of the pelvis were performed. Transabdominal technique was performed for global imaging of the pelvis including uterus, ovaries, adnexal regions, and pelvic cul-de-sac.  Comparison: None.   It was necessary to proceed with endovaginal exam following the transabdominal exam to visualize the uterus, ovaries, and endometrium.  Findings:  Uterus: The uterus measures 10.3 x 5.1 x 6.2 cm.  Heterogeneous myometrial echotexture with focal hypoechoic myometrial lesion measuring about 1.5 cm diameter and located in the posterior left fundus consistent with submucosal fibroid.Small  subserosal fibroid measuring 1 x 1.4 cm in the right fundus.Nabothian cysts in the cervix.  Endometrium: Endometrial stripe thickness measures about 14 mm. Homogeneous appearance.  No endometrial fluid.  Right ovary:  Right ovary measures 4.2 x 2.7 x 2.7 cm.  Normal follicular changes.  Left ovary: Left ovary measures 3.7 x 1.8 x 2.1 cm.  Normal follicular changes.  Other findings: There is a moderate amount of free fluid noted in the pelvis extending around the left adnexal region.  Flow is demonstrated within both ovaries on color flow Doppler imaging.  IMPRESSION: Small uterine fibroids.  Ovaries and endometrium are unremarkable. Moderate free fluid in the left pelvis.  Original Report Authenticated By: Marlon Pel, M.D.   US Breast Bilateral  08/24/2011  *RADIOLOGY REPORT*  Clinical Data:  Patient presents for a 78-month bilateral ultrasound follow-up to evaluate stability of single bilateral masses.  BILATERAL BREAST ULTRASOUND  Comparison:  01/05/2011  Findings: Ultrasound is performed, showing a fairly well defined ovoid hypoechoic solid mass over the 12:30 position of the right breast 7 cm from the nipple measuring 0.9 x 1.1 x 1.4 cm unchanged from the prior exam.  Ultrasound exam over the 12 o'clock position of the left breast 6 cm from the nipple and demonstrates a well defined ovoid hypoechoic solid mass measuring 1 x 2.1 x 2.1 cm.  This measures slightly larger compared to the prior study likely partially due to differences in scan plane measurements. 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  IMPRESSION: Single stable ovoid hypoechoic solid masses at the 12:30 position of the right breast 7 cm from the nipple and 12 o'clock position of the left breast 6 cm from the nipple.  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  Recommendations:  Recommend an additional bilateral diagnostic follow-up ultrasound in 6 months.  BI-RADS CATEGORY 3:  Probably benign finding(s) -  short interval follow-up suggested.  Original Report Authenticated By: Elba Barman, M.D.  MAU Course  Procedures Pelvic complete US series Plevic exam  Rectal exam  UA  Urine beta Hcg  MDM Ovarian cyst-rupture?  UTI Constipation  Dehydration  Vasovagal response  PID BV   Assessment and Plan   Toradol for pain  Macrobid for UTI  Will review [place] results to determine further w/u or discharge.  Assessment:  ruptured left ovarian cyst UTI Plan:  Rx Septra DS Ibuprofen PRN for pain  F/u with Gaynell Face MD.  Kathreen Cornfield, Jared 09/08/2011, 12:23 AM   I have examined this patient and assisted the student with dx and plan of care. I have reviewed this patient's vital signs, nurses notes, appropriate labs and imaging.

## 2012-03-28 ENCOUNTER — Other Ambulatory Visit: Payer: Self-pay | Admitting: Obstetrics

## 2012-03-28 DIAGNOSIS — N63 Unspecified lump in unspecified breast: Secondary | ICD-10-CM

## 2012-04-04 ENCOUNTER — Ambulatory Visit
Admission: RE | Admit: 2012-04-04 | Discharge: 2012-04-04 | Disposition: A | Discharge status: Home / Self Care | Payer: Medicaid Other | Source: Ambulatory Visit | Attending: Obstetrics | Admitting: Obstetrics

## 2012-04-04 DIAGNOSIS — N63 Unspecified lump in unspecified breast: Secondary | ICD-10-CM

## 2012-10-01 ENCOUNTER — Encounter: Payer: Self-pay | Admitting: Obstetrics

## 2012-11-27 ENCOUNTER — Encounter (HOSPITAL_COMMUNITY): Payer: Self-pay

## 2012-11-27 ENCOUNTER — Emergency Department (HOSPITAL_COMMUNITY)
Admission: EM | Admit: 2012-11-27 | Discharge: 2012-11-27 | Disposition: A | Discharge status: Home / Self Care | Payer: Medicaid Other | Attending: Emergency Medicine | Admitting: Emergency Medicine

## 2012-11-27 DIAGNOSIS — Z8669 Personal history of other diseases of the nervous system and sense organs: Secondary | ICD-10-CM | POA: Insufficient documentation

## 2012-11-27 DIAGNOSIS — IMO0001 Reserved for inherently not codable concepts without codable children: Secondary | ICD-10-CM | POA: Insufficient documentation

## 2012-11-27 DIAGNOSIS — R221 Localized swelling, mass and lump, neck: Secondary | ICD-10-CM | POA: Insufficient documentation

## 2012-11-27 DIAGNOSIS — Z862 Personal history of diseases of the blood and blood-forming organs and certain disorders involving the immune mechanism: Secondary | ICD-10-CM | POA: Insufficient documentation

## 2012-11-27 DIAGNOSIS — R110 Nausea: Secondary | ICD-10-CM | POA: Insufficient documentation

## 2012-11-27 DIAGNOSIS — Z8742 Personal history of other diseases of the female genital tract: Secondary | ICD-10-CM | POA: Insufficient documentation

## 2012-11-27 DIAGNOSIS — Z8619 Personal history of other infectious and parasitic diseases: Secondary | ICD-10-CM | POA: Insufficient documentation

## 2012-11-27 DIAGNOSIS — R220 Localized swelling, mass and lump, head: Secondary | ICD-10-CM | POA: Insufficient documentation

## 2012-11-27 DIAGNOSIS — L089 Local infection of the skin and subcutaneous tissue, unspecified: Secondary | ICD-10-CM | POA: Insufficient documentation

## 2012-11-27 HISTORY — DX: Migraine, unspecified, not intractable, without status migrainosus: G43.909

## 2012-11-27 MED ORDER — DOXYCYCLINE HYCLATE 100 MG PO CAPS: 100.0000 mg | ORAL_CAPSULE | Freq: Two times a day (BID) | ORAL | Status: DC

## 2012-11-27 MED ORDER — SULFAMETHOXAZOLE-TRIMETHOPRIM 800-160 MG PO TABS: 1.0000 | ORAL_TABLET | Freq: Two times a day (BID) | ORAL | Status: DC

## 2012-11-27 NOTE — Progress Notes (Signed)
Pt's july 2013 medicaid card scanned in EP[facility] indicates pcp bernard marshall  EPIC updated

## 2012-11-27 NOTE — ED Provider Notes (Signed)
MSE was initiated and I personally evaluated the patient and placed orders (if any) at  11:15 AM on November 27, 2012.  Chelsea Reed is a 27 y/o F with PMHx of migraines, anemia, breast mass presenting to the ED with headache, left eye swelling, and nausea that started today. Patient reported that the left eye felt achy yesterday, reported that this morning she noticed that it was swollen and she noticed blurred vision to the left eye. Patient reported that the left eye feels heavy and sore to the touch. Patient reported that she is having a headache that started today, described as a dull aching headache that is constant with radiation to the left side of the neck. Patient reported that she has history of migraines - [facility] - stated that her migraines coincide with menstrual cycle, before and after. Stated that normally her migraines consist of an aura - patient reported that she sees spots and then has tingling in her hands - stated that she had a migraine about a week ago. Stated that she feels nausea today, denied vomiting. Patient reported dysuria yesterday, denied hematuria. Denied worst headache of life, chest pain, shortness of breath, difficulty breathing, abdominal pain, melena, hematochezia, tingling, confusion, numbness, photophobia, phonophobia, tearing to the eyes, trauma to the eye, sudden loss of vision, facial drooping, speech difficulty.  PCP: Dr. Trudie Buckler Patient moved from fast track to main ED.  The patient appears stable so that the remainder of the MSE may be completed by another provider.  Raymon Mutton, PA-C 11/27/12 2333

## 2012-11-27 NOTE — ED Notes (Signed)
Pt states had a headache last pm, woke up with swelling to lt eye, dullness all over head, not typical migraine headache, pt c/o nausea

## 2012-11-27 NOTE — ED Provider Notes (Signed)
CSN: [id number]     Arrival date & time 11/27/12  1015 History     First MD Initiated Contact with Patient 11/27/12 1045     Chief Complaint  Patient presents with  . Headache  . Nausea  . Facial Swelling   (Consider location/radiation/quality/duration/timing/severity/associated sxs/prior Treatment) Patient is a 27 y.o. female presenting with headaches.  Headache Associated symptoms: myalgias   Associated symptoms: no abdominal pain, no diarrhea, no pain, no fever, no nausea, no numbness, no photophobia and no vomiting    Chelsea Reed is a(n) 27 y.o. female who presents w/ cc eye swelling and headache,. She reprots that the left eye felt achy last night and she began to have a headache.  This is her usual prodrome for Migraine and she went to bed. This morning she noticed mild swelling to the left cheek and eye without visual disturbance, blurred vision, or any of the normal sxs which she has with  Migraine. She denies redness, discharge, itching or eye pain. Patient reports that the left eye feels heavy and sore to the touch.Shecontinues to have dull, ache global headache, and left shoulder pain. She has minimal nausea today and feels otherwise well.Denies photophobia, phonophobia, UL throbbing, vomiting, visual changes, stiff neck, neck pain, rash, or "thunderclap" onset. She states that none of this is like her normal migraine. [place]ills, myalgias, arthralgias. Denies DOE, SOB, chest tightness or pressure, radiation to left arm, jaw or back, or diaphoresis. Denies dysuria, flank pain, suprapubic pain, frequency, urgency, or hematuria. Denies  light headedness, weakness,vertigo. Denies abdominal pain diarrhea or constipation.    Past Medical History  Diagnosis Date  . Anemia   . Heavy periods   . Bilateral ovarian cysts     resolve without intervention  . Trichomonas   . Urinary tract infection   . Migraine    Past Surgical History  Procedure Laterality Date  . Induced  abortion      TABs x 5  . Tooth extraction     No family history on file. History  Substance Use Topics  . Smoking status: Never Smoker   . Smokeless tobacco: Never Used  . Alcohol Use: Yes     Comment: social drinker   OB History   Grav Para Term Preterm Abortions TAB SAB Ect Mult Living   6 1 1  0 5 5 0 0 0 1     Review of Systems  Constitutional: Negative for fever and chills.  HENT: Positive for facial swelling. Negative for trouble swallowing.   Eyes: Negative for photophobia, pain, discharge, redness, itching and visual disturbance.  Respiratory: Negative for shortness of breath.   Cardiovascular: Negative for chest pain.  Gastrointestinal: Negative for nausea, vomiting, abdominal pain, diarrhea and constipation.  Genitourinary: Negative for dysuria and hematuria.  Musculoskeletal: Positive for myalgias. Negative for arthralgias.  Skin: Negative for rash and wound.  Neurological: Positive for headaches. Negative for facial asymmetry, speech difficulty, weakness, light-headedness and numbness.  All other systems reviewed and are negative.    Allergies  Review of patient's allergies indicates no known allergies.  Home Medications  No current outpatient prescriptions on file. BP 120/83  Pulse 68  Temp(Src) 98.9 F (37.2 C)  Resp 20  SpO2 98%  LMP 11/06/2012 Physical Exam  Constitutional: She is oriented to person, place, and time. She appears well-developed and well-nourished. No distress.  HENT:  Head: Normocephalic and atraumatic.  Eyes: Conjunctivae and EOM are normal. Pupils are equal, round, and reactive to light.  No foreign bodies found. Right eye exhibits no chemosis, no discharge, no exudate and no hordeolum. No foreign body present in the right eye. Left eye exhibits no chemosis, no discharge, no exudate and no hordeolum. No foreign body present in the left eye. Right conjunctiva is not injected. Right conjunctiva has no hemorrhage. Left conjunctiva is not  injected. Left conjunctiva has no hemorrhage. No scleral icterus.  Neck: Normal range of motion.  Cardiovascular: Normal rate, regular rhythm and normal heart sounds.  Exam reveals no gallop and no friction rub.   No murmur heard. Pulmonary/Chest: Effort normal and breath sounds normal. No respiratory distress.  Abdominal: Soft. Bowel sounds are normal. She exhibits no distension and no mass. There is no tenderness. There is no guarding.  Neurological: She is alert and oriented to person, place, and time.  Skin: Skin is warm and dry. She is not diaphoretic.    ED Course   Procedures (including critical care time)  Labs Reviewed - No data to display No results found. No diagnosis found.  MDM  Patient headache is minimal. Concern for developing facial cellulitis close to the left eye.  Of discharge the patient with Bactrim for MRSA coverage. Patient is a heavy visual changes or blurred vision. Headache is non-concerning for subarachnoid hemorrhage, migraine headache, meningitis. Patient appears safe for discharge at this time.  I discussed findings patient expresses her understanding and agrees with plan.  Arthor Captain, PA-C 11/30/12 2203

## 2012-11-29 NOTE — ED Provider Notes (Signed)
History/physical exam/procedure(s) were performed by non-physician practitioner and as supervising physician I was immediately available for consultation/collaboration. I have reviewed all notes and am in agreement with care and plan.   Hilario Quarry, MD 11/29/12 2157

## 2012-12-01 NOTE — ED Provider Notes (Signed)
History/physical exam/procedure(s) were performed by non-physician practitioner and as supervising physician I was immediately available for consultation/collaboration. I have reviewed all notes and am in agreement with care and plan.   Hilario Quarry, MD 12/01/12 [phone]

## 2013-07-28 ENCOUNTER — Emergency Department (HOSPITAL_COMMUNITY)
Admission: EM | Admit: 2013-07-28 | Discharge: 2013-07-28 | Disposition: A | Discharge status: Home / Self Care | Payer: Medicaid Other | Attending: Emergency Medicine | Admitting: Emergency Medicine

## 2013-07-28 ENCOUNTER — Encounter (HOSPITAL_COMMUNITY): Payer: Self-pay | Admitting: Emergency Medicine

## 2013-07-28 DIAGNOSIS — N938 Other specified abnormal uterine and vaginal bleeding: Secondary | ICD-10-CM | POA: Insufficient documentation

## 2013-07-28 DIAGNOSIS — R1030 Lower abdominal pain, unspecified: Secondary | ICD-10-CM

## 2013-07-28 DIAGNOSIS — N920 Excessive and frequent menstruation with regular cycle: Secondary | ICD-10-CM

## 2013-07-28 DIAGNOSIS — D649 Anemia, unspecified: Secondary | ICD-10-CM | POA: Insufficient documentation

## 2013-07-28 DIAGNOSIS — N898 Other specified noninflammatory disorders of vagina: Secondary | ICD-10-CM | POA: Insufficient documentation

## 2013-07-28 DIAGNOSIS — Z8742 Personal history of other diseases of the female genital tract: Secondary | ICD-10-CM | POA: Insufficient documentation

## 2013-07-28 DIAGNOSIS — Z3202 Encounter for pregnancy test, result negative: Secondary | ICD-10-CM | POA: Insufficient documentation

## 2013-07-28 DIAGNOSIS — N949 Unspecified condition associated with female genital organs and menstrual cycle: Secondary | ICD-10-CM | POA: Insufficient documentation

## 2013-07-28 LAB — URINALYSIS, ROUTINE W REFLEX MICROSCOPIC
Bilirubin Urine: NEGATIVE
GLUCOSE, UA: NEGATIVE mg/dL
HGB URINE DIPSTICK: NEGATIVE
KETONES UR: 15 mg/dL — AB
LEUKOCYTES UA: NEGATIVE
Nitrite: NEGATIVE
PROTEIN: NEGATIVE mg/dL
Specific Gravity, Urine: 1.029 (ref 1.005–1.030)
Urobilinogen, UA: 0.2 mg/dL (ref 0.0–1.0)
pH: 6.5 (ref 5.0–8.0)

## 2013-07-28 LAB — CBC WITH DIFFERENTIAL/PLATELET
BASOS PCT: 0 % (ref 0–1)
Basophils Absolute: 0 10*3/uL (ref 0.0–0.1)
EOS ABS: 0.1 10*3/uL (ref 0.0–0.7)
EOS PCT: 1 % (ref 0–5)
HEMATOCRIT: 34.8 % — AB (ref 36.0–46.0)
HEMOGLOBIN: 11.4 g/dL — AB (ref 12.0–15.0)
Lymphocytes Relative: 39 % (ref 12–46)
Lymphs Abs: 2.4 10*3/uL (ref 0.7–4.0)
MCH: 29.1 pg (ref 26.0–34.0)
MCHC: 32.8 g/dL (ref 30.0–36.0)
MCV: 88.8 fL (ref 78.0–100.0)
MONO ABS: 0.5 10*3/uL (ref 0.1–1.0)
MONOS PCT: 8 % (ref 3–12)
Neutro Abs: 3.1 10*3/uL (ref 1.7–7.7)
Neutrophils Relative %: 51 % (ref 43–77)
Platelets: 239 10*3/uL (ref 150–400)
RBC: 3.92 MIL/uL (ref 3.87–5.11)
RDW: 13.2 % (ref 11.5–15.5)
WBC: 6 10*3/uL (ref 4.0–10.5)

## 2013-07-28 LAB — COMPREHENSIVE METABOLIC PANEL
ALBUMIN: 4 g/dL (ref 3.5–5.2)
ALT: 13 U/L (ref 0–35)
AST: 21 U/L (ref 0–37)
Alkaline Phosphatase: 70 U/L (ref 39–117)
BUN: 12 mg/dL (ref 6–23)
CO2: 24 mEq/L (ref 19–32)
CREATININE: 0.68 mg/dL (ref 0.50–1.10)
Calcium: 9.4 mg/dL (ref 8.4–10.5)
Chloride: 100 mEq/L (ref 96–112)
GFR calc non Af Amer: >90 mL/min (ref >90)
Glucose, Bld: 87 mg/dL (ref 70–99)
Potassium: 3.6 mEq/L — ABNORMAL LOW (ref 3.7–5.3)
Sodium: 138 mEq/L (ref 137–147)
TOTAL PROTEIN: 6.9 g/dL (ref 6.0–8.3)
Total Bilirubin: 0.3 mg/dL (ref 0.3–1.2)

## 2013-07-28 LAB — WET PREP, GENITAL
Clue Cells Wet Prep HPF POC: NONE SEEN
Trich, Wet Prep: NONE SEEN
Yeast Wet Prep HPF POC: NONE SEEN

## 2013-07-28 LAB — GC/CHLAMYDIA PROBE AMP
CT Probe RNA: NEGATIVE
GC PROBE AMP APTIMA: NEGATIVE

## 2013-07-28 LAB — PREGNANCY, URINE: Preg Test, Ur: NEGATIVE

## 2013-07-28 LAB — LIPASE, BLOOD: LIPASE: 51 U/L (ref 11–59)

## 2013-07-28 NOTE — ED Notes (Signed)
Pt states she has been having lower abd pain for about a month but it just started getting worse tonight  Pt states it was only on the right side but now it is all across her lower abdomen  Pt states her last period was march 23rd  Pt states she is now having a yellow discharge with some blood mixed in

## 2013-07-28 NOTE — Discharge Instructions (Signed)
Follow up with your doctor in 2 days for reevaluation of your symptoms. Return to ED if you develop worsening pain, Fever, intractable Nausea/Vomting, or vaginal bleeding.    Abdominal Pain, Adult Many things can cause belly (abdominal) pain. Most times, the belly pain is not dangerous. Many cases of belly pain can be watched and treated at home. HOME CARE   Do not take medicines that help you go poop (laxatives) unless told to by your doctor.  Only take medicine as told by your doctor.  Eat or drink as told by your doctor. Your doctor will tell you if you should be on a special diet. GET HELP IF:  You do not know what is causing your belly pain.  You have belly pain while you are sick to your stomach (nauseous) or have runny poop (diarrhea).  You have pain while you pee or poop.  Your belly pain wakes you up at night.  You have belly pain that gets worse or better when you eat.  You have belly pain that gets worse when you eat fatty foods. GET HELP RIGHT AWAY IF:   The pain does not go away within 2 hours.  You have a fever.  You keep throwing up (vomiting).  The pain changes and is only in the right or left part of the belly.  You have bloody or tarry looking poop. MAKE SURE YOU:   Understand these instructions.  Will watch your condition.  Will get help right away if you are not doing well or get worse. Document Released: 09/27/2007 Document Revised: 01/29/2013 Document Reviewed: 12/18/2012 [facility] Patient Information 2014 [place].

## 2013-07-28 NOTE — ED Provider Notes (Signed)
CSN: [id number]     Arrival date & time 07/28/13  0052 History   First MD Initiated Contact with Patient 07/28/13 0321     Chief Complaint  Patient presents with  . Abdominal Pain     (Consider location/radiation/quality/duration/timing/severity/associated sxs/prior Treatment) HPI 28 yo female with hx of Trichomonas infections and bilateral ovarian cysts presents with lower abdominal crampy pain that has been ongoing for the past month and just recently worsened in the past day. Patient states she had some yellow discharge from her vagina today and noted some spotting. LMP was 07/14/13. Denies N/V/D/C. No fever/chills. No pain with urination. No new sexual contacts. PMH significant for anemia and heavy periods.   Past Medical History  Diagnosis Date  . Anemia   . Heavy periods   . Bilateral ovarian cysts     resolve without intervention  . Trichomonas   . Urinary tract infection   . Migraine    Past Surgical History  Procedure Laterality Date  . Induced abortion      TABs x 5  . Tooth extraction     Family History  Problem Relation Age of Onset  . Hypertension Other   . Diabetes Other    History  Substance Use Topics  . Smoking status: Never Smoker   . Smokeless tobacco: Never Used  . Alcohol Use: Yes     Comment: social drinker   OB History   Grav Para Term Preterm Abortions TAB SAB Ect Mult Living   6 1 1  0 5 5 0 0 0 1     Review of Systems  All other systems reviewed and are negative.     Allergies  Review of patient's allergies indicates no known allergies.  Home Medications   Current Outpatient Rx  Name  Route  Sig  Dispense  Refill  . Multiple Vitamin (MULTIVITAMIN WITH MINERALS) TABS tablet   Oral   Take 1 tablet by mouth daily.          BP 120/79  Pulse 72  Temp(Src) 98.2 F (36.8 C) (Oral)  Resp 16  Ht 5\' 5"  (1.651 m)  Wt 165 lb (74.844 kg)  BMI 27.46 kg/m2  SpO2 99%  LMP 07/14/2013 Physical Exam  Nursing note and vitals  reviewed. Constitutional: She is oriented to person, place, and time. She appears well-developed and well-nourished. No distress.  HENT:  Head: Normocephalic and atraumatic.  Mouth/Throat: Oropharynx is clear and moist.  Eyes: Conjunctivae are normal. No scleral icterus.  Neck: No JVD present. No tracheal deviation present.  Cardiovascular: Normal rate and regular rhythm.  Exam reveals no gallop and no friction rub.   No murmur heard. Pulmonary/Chest: Effort normal and breath sounds normal. No respiratory distress. She has no wheezes. She has no rhonchi. She has no rales.  Abdominal: Soft. Bowel sounds are normal. She exhibits no distension. There is no hepatosplenomegaly. There is no tenderness. There is no rigidity, no rebound, no guarding, no tenderness at McBurney's point and negative Murphy's sign.  Musculoskeletal: She exhibits no edema.  Neurological: She is alert and oriented to person, place, and time.  Skin: Skin is warm and dry. She is not diaphoretic.  Psychiatric: She has a normal mood and affect. Her behavior is normal.    ED Course  Procedures (including critical care time) Labs Review Labs Reviewed  WET PREP, GENITAL - Abnormal; Notable for the following:    WBC, Wet Prep HPF POC FEW (*)    All other components within  normal limits  URINALYSIS, ROUTINE W REFLEX MICROSCOPIC - Abnormal; Notable for the following:    Ketones, ur 15 (*)    All other components within normal limits  CBC WITH DIFFERENTIAL - Abnormal; Notable for the following:    Hemoglobin 11.4 (*)    HCT 34.8 (*)    All other components within normal limits  COMPREHENSIVE METABOLIC PANEL - Abnormal; Notable for the following:    Potassium 3.6 (*)    All other components within normal limits  GC/CHLAMYDIA PROBE AMP  PREGNANCY, URINE  LIPASE, BLOOD   Imaging Review No results found.   EKG Interpretation None      MDM   Final diagnoses:  Lower abdominal pain  Vaginal discharge  Spotting    Patient afebrile w/ normal VS.  Patient in NAD.  Mild anemia with hgb of 11.4 appears stable UA negative for UTI  Wet prep negative for yeast, trich, and clue cells G/C negative Lipase negative Given exam and workup, no concern for pancreatitis, cholecystitis, TOA, ectopic, ovarian torsion, appendicitis, or PID.   Upon reexamination patient appears to be improving without tx. Patient abdomen is soft and nontender at this time.  Patient tolerating POs in ED at this time.  Plan to have patient follow up with PCP .  Return precautions given for any worsening abdominal pain, fever/chills, intractable nausea/vomiting, vaginal bleeding, or patient unable to tolerate POs. Patient invited to return to ED for reevaluation at any point if they feel their condition is getting worse. Patient confirms understanding. Agrees with plan. Discharged in good condition.    Meds given in ED:  Medications - No data to display  Discharge Medication List as of 07/28/2013  6:23 AM        Sherrie George, PA-C 07/30/13 2047

## 2013-07-30 NOTE — ED Provider Notes (Signed)
Shared service with midlevel provider. I have personally seen and examined the patient, providing direct face to face care, presenting with the chief complaint of vaginal discharge and lower abd pain. Physical exam findings include lower quadrant tenderness, with normal pelvic exam. Plan will be to treat any gynecologic disease we find, otherwise, pcp followup. I have reviewed the nursing documentation on past medical history, family history, and social history.   Varney Biles, MD 07/31/13 0000

## 2014-02-23 ENCOUNTER — Encounter (HOSPITAL_COMMUNITY): Payer: Self-pay | Admitting: Emergency Medicine

## 2014-09-14 ENCOUNTER — Emergency Department (HOSPITAL_COMMUNITY)
Admission: EM | Admit: 2014-09-14 | Discharge: 2014-09-14 | Disposition: A | Discharge status: Home / Self Care | Payer: Medicaid Other | Attending: Emergency Medicine | Admitting: Emergency Medicine

## 2014-09-14 ENCOUNTER — Encounter (HOSPITAL_COMMUNITY): Payer: Self-pay | Admitting: *Deleted

## 2014-09-14 DIAGNOSIS — Z8742 Personal history of other diseases of the female genital tract: Secondary | ICD-10-CM | POA: Diagnosis not present

## 2014-09-14 DIAGNOSIS — Z87440 Personal history of urinary (tract) infections: Secondary | ICD-10-CM | POA: Diagnosis not present

## 2014-09-14 DIAGNOSIS — Z8679 Personal history of other diseases of the circulatory system: Secondary | ICD-10-CM | POA: Insufficient documentation

## 2014-09-14 DIAGNOSIS — R21 Rash and other nonspecific skin eruption: Secondary | ICD-10-CM | POA: Diagnosis present

## 2014-09-14 DIAGNOSIS — Z79899 Other long term (current) drug therapy: Secondary | ICD-10-CM | POA: Insufficient documentation

## 2014-09-14 DIAGNOSIS — Z8619 Personal history of other infectious and parasitic diseases: Secondary | ICD-10-CM | POA: Diagnosis not present

## 2014-09-14 DIAGNOSIS — L309 Dermatitis, unspecified: Secondary | ICD-10-CM

## 2014-09-14 DIAGNOSIS — L219 Seborrheic dermatitis, unspecified: Secondary | ICD-10-CM | POA: Insufficient documentation

## 2014-09-14 DIAGNOSIS — Z862 Personal history of diseases of the blood and blood-forming organs and certain disorders involving the immune mechanism: Secondary | ICD-10-CM | POA: Diagnosis not present

## 2014-09-14 HISTORY — DX: Dermatitis, unspecified: L30.9

## 2014-09-14 MED ORDER — SALICYLIC ACID-SULFUR 2-2 % EX SHAM: MEDICATED_SHAMPOO | Freq: Every day | CUTANEOUS | Status: DC | PRN

## 2014-09-14 MED ORDER — EUCERIN EX CREA: TOPICAL_CREAM | CUTANEOUS | Status: DC | PRN

## 2014-09-14 NOTE — ED Provider Notes (Signed)
CSN: [id number]     Arrival date & time 09/14/14  2127 History   First MD Initiated Contact with Patient 09/14/14 2212     This chart was scribed for non-physician practitioner, Junius Creamer, [place] working with Serita Grit, MD by Forrestine Him, ED Scribe. This patient was seen in room WTR6/WTR6 and the patient's care was started at 10:51 PM.   Chief Complaint  Patient presents with  . Hair/Scalp Problem   The history is provided by the patient. No language interpreter was used.    HPI Comments: Chelsea Reed is a 29 y.o. female who presents to the Emergency Department here for a hair/scalp issue this evening. Pt reports severe itching and flaking she relates to her eczema. Pt previously diagnosed with eczema to the scalp and was evaluated by a dermatologist 2 years ago. At time of visit, she was prescribed several medications to help manage symptoms. Pt is now out of medications and has noticed her eczema flaring up. Ms. Dusek has attempted OTC Hydrocortisone without any improvement for symptoms. Pt not able to get in with dermatologist or establish with a new provider due to coverge difficulties. No known allergies to medications.  Past Medical History  Diagnosis Date  . Anemia   . Heavy periods   . Bilateral ovarian cysts     resolve without intervention  . Trichomonas   . Urinary tract infection   . Migraine   . Eczema    Past Surgical History  Procedure Laterality Date  . Induced abortion      TABs x 5  . Tooth extraction     Family History  Problem Relation Age of Onset  . Hypertension Other   . Diabetes Other    History  Substance Use Topics  . Smoking status: Never Smoker   . Smokeless tobacco: Never Used  . Alcohol Use: Yes     Comment: social drinker   OB History    Gravida Para Term Preterm AB TAB SAB Ectopic Multiple Living   6 1 1  0 5 5 0 0 0 1     Review of Systems  Constitutional: Negative for fever and chills.  Skin: Positive for rash.       Allergies  Review of patient's allergies indicates no known allergies.  Home Medications   Prior to Admission medications   Medication Sig Start Date End Date Taking? Authorizing Provider  hydrocortisone cream 1 % Apply 1 application topically 2 (two) times daily as needed for itching.   Yes Historical Provider, MD  Multiple Vitamin (MULTIVITAMIN WITH MINERALS) TABS tablet Take 1 tablet by mouth daily.   Yes Historical Provider, MD  salicyclic acid-sulfur (SEBULEX) 2-2 % shampoo Apply topically daily as needed for itching. 09/14/14   Junius Creamer, NP  Skin Protectants, Misc. (EUCERIN) cream Apply topically as needed for dry skin. 09/14/14   Junius Creamer, NP   Triage Vitals: BP 132/82 mmHg  Pulse 70  Temp(Src) 98.7 F (37.1 C) (Oral)  Resp 18  Ht 5\' 4"  (1.626 m)  Wt 170 lb (77.111 kg)  BMI 29.17 kg/m2  SpO2 100%  LMP 08/23/2014   Physical Exam  Constitutional: She is oriented to person, place, and time. She appears well-developed and well-nourished.  HENT:  Head: Normocephalic.  Red flakey skin on scalp and face  Eyes: EOM are normal.  Neck: Normal range of motion.  Pulmonary/Chest: Effort normal.  Abdominal: She exhibits no distension.  Musculoskeletal: Normal range of motion.  Neurological: She is  alert and oriented to person, place, and time.  Skin:  Several areas on patient's neck, face, arms, red, itchy, irritated with the consistency of leather.  Psychiatric: She has a normal mood and affect.  Nursing note and vitals reviewed.   ED Course  Procedures (including critical care time)  DIAGNOSTIC STUDIES: Oxygen Saturation is 100% on RA, Normal by my interpretation.    COORDINATION OF CARE: 10:59 PM- Will prescribe needed medications to help manage symptoms. Discussed treatment plan with pt at bedside and pt agreed to plan.     Labs Review Labs Reviewed - No data to display  Imaging Review No results found.   EKG Interpretation None      MDM    Final diagnoses:  Eczema of face  Seborrheic eczema of scalp   I personally performed the services described in this documentation, which was scribed in my presence. The recorded information has been reviewed and is accurate.  Junius Creamer, NP 09/14/14 2316  Serita Grit, MD 09/18/14 2017

## 2014-09-14 NOTE — ED Notes (Signed)
Pt states that she has been diagnosed with eczema to her scalp in the past; pt states that she saw a dermatologist 2 yrs ago and was prescribed several medications; pt states that she is out of all those medicines and has been having itching and flaking to her scalp; pt states that she has been using over the counter products with no relief; pt states that she has called a few dermatologist but they are not accepting new Medicaid patients

## 2014-10-21 ENCOUNTER — Emergency Department (HOSPITAL_COMMUNITY)
Admission: EM | Admit: 2014-10-21 | Discharge: 2014-10-21 | Disposition: A | Discharge status: Home / Self Care | Payer: Medicaid Other | Attending: Emergency Medicine | Admitting: Emergency Medicine

## 2014-10-21 ENCOUNTER — Encounter (HOSPITAL_COMMUNITY): Payer: Self-pay | Admitting: Emergency Medicine

## 2014-10-21 ENCOUNTER — Emergency Department (HOSPITAL_COMMUNITY): Payer: Medicaid Other

## 2014-10-21 DIAGNOSIS — Z79899 Other long term (current) drug therapy: Secondary | ICD-10-CM | POA: Diagnosis not present

## 2014-10-21 DIAGNOSIS — J069 Acute upper respiratory infection, unspecified: Secondary | ICD-10-CM | POA: Insufficient documentation

## 2014-10-21 DIAGNOSIS — Z8742 Personal history of other diseases of the female genital tract: Secondary | ICD-10-CM | POA: Diagnosis not present

## 2014-10-21 DIAGNOSIS — Z862 Personal history of diseases of the blood and blood-forming organs and certain disorders involving the immune mechanism: Secondary | ICD-10-CM | POA: Insufficient documentation

## 2014-10-21 DIAGNOSIS — Z872 Personal history of diseases of the skin and subcutaneous tissue: Secondary | ICD-10-CM | POA: Insufficient documentation

## 2014-10-21 DIAGNOSIS — Z87440 Personal history of urinary (tract) infections: Secondary | ICD-10-CM | POA: Diagnosis not present

## 2014-10-21 DIAGNOSIS — Z8619 Personal history of other infectious and parasitic diseases: Secondary | ICD-10-CM | POA: Diagnosis not present

## 2014-10-21 DIAGNOSIS — Z8679 Personal history of other diseases of the circulatory system: Secondary | ICD-10-CM | POA: Diagnosis not present

## 2014-10-21 DIAGNOSIS — R0602 Shortness of breath: Secondary | ICD-10-CM | POA: Diagnosis present

## 2014-10-21 LAB — CBC
HCT: 33.9 % — ABNORMAL LOW (ref 36.0–46.0)
HEMOGLOBIN: 10.8 g/dL — AB (ref 12.0–15.0)
MCH: 27.8 pg (ref 26.0–34.0)
MCHC: 31.9 g/dL (ref 30.0–36.0)
MCV: 87.1 fL (ref 78.0–100.0)
PLATELETS: 247 10*3/uL (ref 150–400)
RBC: 3.89 MIL/uL (ref 3.87–5.11)
RDW: 13.2 % (ref 11.5–15.5)
WBC: 5.8 10*3/uL (ref 4.0–10.5)

## 2014-10-21 LAB — BASIC METABOLIC PANEL
Anion gap: 5 (ref 5–15)
BUN: 12 mg/dL (ref 6–20)
CO2: 25 mmol/L (ref 22–32)
CREATININE: 0.77 mg/dL (ref 0.44–1.00)
Calcium: 8.8 mg/dL — ABNORMAL LOW (ref 8.9–10.3)
Chloride: 108 mmol/L (ref 101–111)
GFR calc non Af Amer: >60 mL/min (ref >60)
Glucose, Bld: 129 mg/dL — ABNORMAL HIGH (ref 65–99)
Potassium: 3.1 mmol/L — ABNORMAL LOW (ref 3.5–5.1)
Sodium: 138 mmol/L (ref 135–145)

## 2014-10-21 LAB — BRAIN NATRIURETIC PEPTIDE: B NATRIURETIC PEPTIDE 5: 14.1 pg/mL (ref 0.0–100.0)

## 2014-10-21 LAB — I-STAT TROPONIN, ED: Troponin i, poc: 0 ng/mL (ref 0.00–0.08)

## 2014-10-21 MED ORDER — GUAIFENESIN-CODEINE 100-10 MG/5ML PO SOLN: 5.0000 mL | Freq: Three times a day (TID) | ORAL | Status: DC | PRN

## 2014-10-21 NOTE — ED Provider Notes (Signed)
CSN: [id number]     Arrival date & time 10/21/14  2116 History   First MD Initiated Contact with Patient 10/21/14 2129     Chief Complaint  Patient presents with  . Shortness of Breath  . Chest Pain     (Consider location/radiation/quality/duration/timing/severity/associated sxs/prior Treatment) HPI Chelsea Reed is a 29 y.o. female who comes in for evaluation of cough, shortness of breath, chest discomfort. Patient states for the past week and a half to weeks she has developed a cold. She reports a productive sputum. She reports shortness of breath after coughing and walking long distances. Reports associated central and right chest discomfort only when coughing. She denies any fevers, chills, nausea, vomiting, abdominal pain, numbness or weakness. She has not tried any interventions to improve her symptoms. Nothing makes it better or worse. No other aggravating or modifying factors.  Past Medical History  Diagnosis Date  . Anemia   . Heavy periods   . Bilateral ovarian cysts     resolve without intervention  . Trichomonas   . Urinary tract infection   . Migraine   . Eczema    Past Surgical History  Procedure Laterality Date  . Induced abortion      TABs x 5  . Tooth extraction     Family History  Problem Relation Age of Onset  . Hypertension Other   . Diabetes Other    History  Substance Use Topics  . Smoking status: Never Smoker   . Smokeless tobacco: Never Used  . Alcohol Use: Yes     Comment: social drinker   OB History    Gravida Para Term Preterm AB TAB SAB Ectopic Multiple Living   6 1 1  0 5 5 0 0 0 1     Review of Systems A 10 point review of systems was completed and was negative except for pertinent positives and negatives as mentioned in the history of present illness     Allergies  Review of patient's allergies indicates no known allergies.  Home Medications   Prior to Admission medications   Medication Sig Start Date End Date Taking?  Authorizing Provider  ibuprofen (ADVIL,MOTRIN) 200 MG tablet Take 400 mg by mouth every 6 (six) hours as needed for moderate pain.   Yes Historical Provider, MD  Multiple Vitamin (MULTIVITAMIN WITH MINERALS) TABS tablet Take 1 tablet by mouth daily.   Yes Historical Provider, MD  guaiFENesin-codeine 100-10 MG/5ML syrup Take 5 mLs by mouth 3 (three) times daily as needed for cough. 10/21/14   Comer Locket, PA-C  salicyclic acid-sulfur (SEBULEX) 2-2 % shampoo Apply topically daily as needed for itching. Patient not taking: Reported on 10/21/2014 09/14/14   Junius Creamer, NP  Skin Protectants, Misc. (EUCERIN) cream Apply topically as needed for dry skin. Patient not taking: Reported on 10/21/2014 09/14/14   Junius Creamer, NP   BP 136/74 mmHg  Pulse 66  Temp(Src) 98 F (36.7 C) (Oral)  Resp 17  SpO2 100%  LMP 10/18/2014 Physical Exam  Constitutional: She is oriented to person, place, and time. She appears well-developed and well-nourished.  HENT:  Head: Normocephalic and atraumatic.  Mouth/Throat: Oropharynx is clear and moist.  Eyes: Conjunctivae are normal. Pupils are equal, round, and reactive to light. Right eye exhibits no discharge. Left eye exhibits no discharge. No scleral icterus.  Neck: Neck supple.  Cardiovascular: Normal rate, regular rhythm and normal heart sounds.   Pulmonary/Chest: Effort normal and breath sounds normal. No respiratory distress. She has no  wheezes. She has no rales.  Abdominal: Soft. There is no tenderness.  Musculoskeletal: She exhibits no edema or tenderness.  Neurological: She is alert and oriented to person, place, and time.  Cranial Nerves II-XII grossly intact  Skin: Skin is warm and dry. No rash noted.  Psychiatric: She has a normal mood and affect.  Nursing note and vitals reviewed.   ED Course  Procedures (including critical care time) Labs Review Labs Reviewed  CBC - Abnormal; Notable for the following:    Hemoglobin 10.8 (*)    HCT 33.9 (*)     All other components within normal limits  BASIC METABOLIC PANEL - Abnormal; Notable for the following:    Potassium 3.1 (*)    Glucose, Bld 129 (*)    Calcium 8.8 (*)    All other components within normal limits  BRAIN NATRIURETIC PEPTIDE  I-STAT TROPOININ, ED    Imaging Review Dg Chest 2 View  10/21/2014   CLINICAL DATA:  Right side chest pain starting yesterday, shortness of Breath  EXAM: CHEST  2 VIEW  COMPARISON:  None.  FINDINGS: Cardiomediastinal silhouette is unremarkable. No acute infiltrate or pleural effusion. No pulmonary edema. Bony thorax is unremarkable.  IMPRESSION: No active cardiopulmonary disease.   Electronically Signed   By: Lahoma Crocker M.D.   On: 10/21/2014 21:43     EKG Interpretation None      ED ECG REPORT   Date: 10/22/2014  Rate: 74  Rhythm: normal sinus rhythm  QRS Axis: normal  Intervals: normal  ST/T Wave abnormalities: nonspecific T wave changes  Conduction Disutrbances:none  Narrative Interpretation:   Old EKG Reviewed: none available  I have personally reviewed the EKG tracing and agree with the computerized printout as noted.  Meds given in ED:  Medications - No data to display  Discharge Medication List as of 10/21/2014 10:28 PM    START taking these medications   Details  guaiFENesin-codeine 100-10 MG/5ML syrup Take 5 mLs by mouth 3 (three) times daily as needed for cough., Starting 10/21/2014, Until Discontinued, Print       Filed Vitals:   10/21/14 2121 10/21/14 2230 10/21/14 2300  BP: 136/78 123/72 136/74  Pulse: 73 70 66  Temp: 98 F (36.7 C)    TempSrc: Oral    Resp: 18 18 17   SpO2: 100% 99% 100%    MDM  Vitals stable - WNL -afebrile Pt resting comfortably in ED. PE--normal lung exam. Physical exam otherwise benign. Labwork--labs are noncontributory, hemoglobin is baseline. EKG not concerning.  Imaging--chest x-ray shows no active cardiopulmonary disease. I personally reviewed the imaging and agree with the results  as interpreted by the radiologist.   DDX--clinical picture consistent with acute bronchitis. No evidence of pneumonia. Patient is PERC negative. Doubt cardiac etiology. No evidence of other acute or emergent pathologies time. Will treat symptomatically and DC with cough medicines.  I discussed all relevant lab findings and imaging results with pt and they verbalized understanding. Discussed f/u with PCP within 48 hrs and return precautions, pt very amenable to plan.  Final diagnoses:  URI, acute       Comer Locket, PA-C 10/22/14 0019  Evelina Bucy, MD 10/22/14 [phone]

## 2014-10-21 NOTE — ED Notes (Signed)
EKG given to EDP, Walden,MD., for review. 

## 2014-10-21 NOTE — Discharge Instructions (Signed)
You were seen in the ED today for your cough and shortness of breath. You are likely suffering from a upper respiratory infection. Your labs, exam and x-ray are all reassuring. Please follow-up with primary care for further evaluation and management of your symptoms. Please take your medications as prescribed. Return to ED for worsening symptoms.  Upper Respiratory Infection, Adult An upper respiratory infection (URI) is also sometimes known as the common cold. The upper respiratory tract includes the nose, sinuses, throat, trachea, and bronchi. Bronchi are the airways leading to the lungs. Most people improve within 1 week, but symptoms can last up to 2 weeks. A residual cough may last even longer.  CAUSES Many different viruses can infect the tissues lining the upper respiratory tract. The tissues become irritated and inflamed and often become very moist. Mucus production is also common. A cold is contagious. You can easily spread the virus to others by oral contact. This includes kissing, sharing a glass, coughing, or sneezing. Touching your mouth or nose and then touching a surface, which is then touched by another person, can also spread the virus. SYMPTOMS  Symptoms typically develop 1 to 3 days after you come in contact with a cold virus. Symptoms vary from person to person. They may include:  Runny nose.  Sneezing.  Nasal congestion.  Sinus irritation.  Sore throat.  Loss of voice (laryngitis).  Cough.  Fatigue.  Muscle aches.  Loss of appetite.  Headache.  Low-grade fever. DIAGNOSIS  You might diagnose your own cold based on familiar symptoms, since most people get a cold 2 to 3 times a year. Your caregiver can confirm this based on your exam. Most importantly, your caregiver can check that your symptoms are not due to another disease such as strep throat, sinusitis, pneumonia, asthma, or epiglottitis. Blood tests, throat tests, and X-rays are not necessary to diagnose a  common cold, but they may sometimes be helpful in excluding other more serious diseases. Your caregiver will decide if any further tests are required. RISKS AND COMPLICATIONS  You may be at risk for a more severe case of the common cold if you smoke cigarettes, have chronic heart disease (such as heart failure) or lung disease (such as asthma), or if you have a weakened immune system. The very young and very old are also at risk for more serious infections. Bacterial sinusitis, middle ear infections, and bacterial pneumonia can complicate the common cold. The common cold can worsen asthma and chronic obstructive pulmonary disease (COPD). Sometimes, these complications can require emergency medical care and may be life-threatening. PREVENTION  The best way to protect against getting a cold is to practice good hygiene. Avoid oral or hand contact with people with cold symptoms. Wash your hands often if contact occurs. There is no clear evidence that vitamin C, vitamin E, echinacea, or exercise reduces the chance of developing a cold. However, it is always recommended to get plenty of rest and practice good nutrition. TREATMENT  Treatment is directed at relieving symptoms. There is no cure. Antibiotics are not effective, because the infection is caused by a virus, not by bacteria. Treatment may include:  Increased fluid intake. Sports drinks offer valuable electrolytes, sugars, and fluids.  Breathing heated mist or steam (vaporizer or shower).  Eating chicken soup or other clear broths, and maintaining good nutrition.  Getting plenty of rest.  Using gargles or lozenges for comfort.  Controlling fevers with ibuprofen or acetaminophen as directed by your caregiver.  Increasing usage of  your inhaler if you have asthma. Zinc gel and zinc lozenges, taken in the first 24 hours of the common cold, can shorten the duration and lessen the severity of symptoms. Pain medicines may help with fever, muscle  aches, and throat pain. A variety of non-prescription medicines are available to treat congestion and runny nose. Your caregiver can make recommendations and may suggest nasal or lung inhalers for other symptoms.  HOME CARE INSTRUCTIONS   Only take over-the-counter or prescription medicines for pain, discomfort, or fever as directed by your caregiver.  Use a warm mist humidifier or inhale steam from a shower to increase air moisture. This may keep secretions moist and make it easier to breathe.  Drink enough water and fluids to keep your urine clear or pale yellow.  Rest as needed.  Return to work when your temperature has returned to normal or as your caregiver advises. You may need to stay home longer to avoid infecting others. You can also use a face mask and careful hand washing to prevent spread of the virus. SEEK MEDICAL CARE IF:   After the first few days, you feel you are getting worse rather than better.  You need your caregiver's advice about medicines to control symptoms.  You develop chills, worsening shortness of breath, or brown or red sputum. These may be signs of pneumonia.  You develop yellow or brown nasal discharge or pain in the face, especially when you bend forward. These may be signs of sinusitis.  You develop a fever, swollen neck glands, pain with swallowing, or white areas in the back of your throat. These may be signs of strep throat. SEEK IMMEDIATE MEDICAL CARE IF:   You have a fever.  You develop severe or persistent headache, ear pain, sinus pain, or chest pain.  You develop wheezing, a prolonged cough, cough up blood, or have a change in your usual mucus (if you have chronic lung disease).  You develop sore muscles or a stiff neck. Document Released: 10/04/2000 Document Revised: 07/03/2011 Document Reviewed: 07/16/2013 [facility] Patient Information 2015 [place], [place]. This information is not intended to replace advice given to you by your health care  provider. Make sure you discuss any questions you have with your health care provider.

## 2014-10-21 NOTE — ED Notes (Signed)
Pt states that she has had a cold but for the past 2 days she has felt SOB and has had R sided chest pain. Alert and oriented.

## 2014-12-20 ENCOUNTER — Encounter (HOSPITAL_COMMUNITY): Payer: Self-pay

## 2014-12-20 ENCOUNTER — Inpatient Hospital Stay (HOSPITAL_COMMUNITY)
Admission: AD | Admit: 2014-12-20 | Discharge: 2014-12-21 | Disposition: A | Discharge status: Home / Self Care | Payer: Medicaid Other | Source: Ambulatory Visit | Attending: Obstetrics | Admitting: Obstetrics

## 2014-12-20 DIAGNOSIS — N949 Unspecified condition associated with female genital organs and menstrual cycle: Secondary | ICD-10-CM

## 2014-12-20 DIAGNOSIS — R102 Pelvic and perineal pain: Secondary | ICD-10-CM | POA: Diagnosis present

## 2014-12-20 DIAGNOSIS — N760 Acute vaginitis: Secondary | ICD-10-CM | POA: Insufficient documentation

## 2014-12-20 LAB — URINALYSIS, ROUTINE W REFLEX MICROSCOPIC
Bilirubin Urine: NEGATIVE
GLUCOSE, UA: NEGATIVE mg/dL
HGB URINE DIPSTICK: NEGATIVE
Ketones, ur: 15 mg/dL — AB
Leukocytes, UA: NEGATIVE
Nitrite: NEGATIVE
PH: 8 (ref 5.0–8.0)
Protein, ur: NEGATIVE mg/dL
SPECIFIC GRAVITY, URINE: 1.02 (ref 1.005–1.030)
Urobilinogen, UA: 1 mg/dL (ref 0.0–1.0)

## 2014-12-20 LAB — POCT PREGNANCY, URINE: Preg Test, Ur: NEGATIVE

## 2014-12-20 LAB — WET PREP, GENITAL
Trich, Wet Prep: NONE SEEN
Yeast Wet Prep HPF POC: NONE SEEN

## 2014-12-20 MED ORDER — IBUPROFEN 600 MG PO TABS
600.0000 mg | ORAL_TABLET | Freq: Once | ORAL | Status: AC
  Administered 2014-12-20: 600 mg via ORAL
  Filled 2014-12-20: qty 1

## 2014-12-20 MED ORDER — IBUPROFEN 600 MG PO TABS: 600.0000 mg | ORAL_TABLET | Freq: Once | ORAL | Status: DC

## 2014-12-20 MED ORDER — METRONIDAZOLE 500 MG PO TABS: 500.0000 mg | ORAL_TABLET | Freq: Two times a day (BID) | ORAL | Status: DC

## 2014-12-20 NOTE — MAU Note (Signed)
Pt presents complaining of left lower abdominal pain and spotting. Made an appt with Dr. Ruthann Cancer but can't be seen until later this month. Had unprotected sex 3 weeks ago and is worried about STD.

## 2014-12-20 NOTE — MAU Provider Note (Signed)
History   [id number]   Chief Complaint  Patient presents with  . Abdominal Pain  . Vaginal Bleeding    HPI Chelsea Reed is a 29 y.o. female  [phone] here with report of spotting of blood x 2 days.  Blood noted each time she wipes.  Also reports yellow vaginal discharge x 1 week with odor.  Pain reported on lower left pelvic intermittently x one week.  Pain is described as a achy pain rated a 6/10.  Pain radiates to lower left side of back.  Blood noted at the time pain started.    Patient's last menstrual period was 12/07/2014.  OB History  Gravida Para Term Preterm AB SAB TAB Ectopic Multiple Living  6 1 1  0 5 0 5 0 0 1    # Outcome Date GA Lbr Len/2nd Weight Sex Delivery Anes PTL Lv  6 TAB           5 TAB           4 TAB           3 TAB           2 TAB           1 Term      Vag-Spont         Past Medical History  Diagnosis Date  . Anemia   . Heavy periods   . Bilateral ovarian cysts     resolve without intervention  . Trichomonas   . Urinary tract infection   . Migraine   . Eczema     Family History  Problem Relation Age of Onset  . Hypertension Other   . Diabetes Other     Social History   Social History  . Marital Status: Single    Spouse Name: N/A  . Number of Children: N/A  . Years of Education: N/A   Social History Main Topics  . Smoking status: Never Smoker   . Smokeless tobacco: Never Used  . Alcohol Use: Yes     Comment: social drinker  . Drug Use: No  . Sexual Activity: Yes    Birth Control/ Protection: None   Other Topics Concern  . None   Social History Narrative    No Known Allergies  No current facility-administered medications on file prior to encounter.   Current Outpatient Prescriptions on File Prior to Encounter  Medication Sig Dispense Refill  . guaiFENesin-codeine 100-10 MG/5ML syrup Take 5 mLs by mouth 3 (three) times daily as needed for cough. 120 mL 0  . ibuprofen (ADVIL,MOTRIN) 200 MG tablet Take 400 mg by mouth  every 6 (six) hours as needed for moderate pain.    . Multiple Vitamin (MULTIVITAMIN WITH MINERALS) TABS tablet Take 1 tablet by mouth daily.    . salicyclic acid-sulfur (SEBULEX) 2-2 % shampoo Apply topically daily as needed for itching. (Patient not taking: Reported on 10/21/2014) 118 mL 12  . Skin Protectants, Misc. (EUCERIN) cream Apply topically as needed for dry skin. (Patient not taking: Reported on 10/21/2014) 454 g 0     Review of Systems  Constitutional: Negative for fever.  Gastrointestinal: Positive for nausea and vomiting. Negative for abdominal pain.  Genitourinary: Positive for frequency, vaginal bleeding, vaginal discharge and pelvic pain. Negative for dysuria, hematuria and flank pain.  Musculoskeletal: Positive for back pain.  All other systems reviewed and are negative.    Physical Exam   Filed Vitals:   12/20/14 2233  BP: 129/89  Pulse: 70  Temp: 97.9 F (36.6 C)  TempSrc: Oral  Resp: 18    Physical Exam  Constitutional: She is oriented to person, place, and time. She appears well-developed and well-nourished. No distress.  HENT:  Head: Normocephalic.  Neck: Neck supple.  Cardiovascular: Normal rate, regular rhythm and normal heart sounds.   Respiratory: Effort normal and breath sounds normal. No respiratory distress.  GI: Soft. She exhibits no mass. There is no tenderness. There is no rebound, no guarding and no CVA tenderness.  Genitourinary: Cervix exhibits no motion tenderness and no discharge. Right adnexum displays no mass and no tenderness. Left adnexum displays tenderness. Left adnexum displays no mass. There is bleeding in the vagina. Vaginal discharge (white, creamy) found.  Musculoskeletal: Normal range of motion. She exhibits no edema.  Neurological: She is alert and oriented to person, place, and time.  Skin: Skin is warm and dry.  Psychiatric: She has a normal mood and affect.    MAU Course  Procedures  Results for orders placed or  performed during the hospital encounter of 12/20/14 (from the past 24 hour(s))  Urinalysis, Routine w reflex microscopic (not at [facility])     Status: Abnormal   Collection Time: 12/20/14 10:26 PM  Result Value Ref Range   Color, Urine YELLOW YELLOW   APPearance CLEAR CLEAR   Specific Gravity, Urine 1.020 1.005 - 1.030   pH 8.0 5.0 - 8.0   Glucose, UA NEGATIVE NEGATIVE mg/dL   Hgb urine dipstick NEGATIVE NEGATIVE   Bilirubin Urine NEGATIVE NEGATIVE   Ketones, ur 15 (A) NEGATIVE mg/dL   Protein, ur NEGATIVE NEGATIVE mg/dL   Urobilinogen, UA 1.0 0.0 - 1.0 mg/dL   Nitrite NEGATIVE NEGATIVE   Leukocytes, UA NEGATIVE NEGATIVE  Pregnancy, urine POC     Status: None   Collection Time: 12/20/14 10:32 PM  Result Value Ref Range   Preg Test, Ur NEGATIVE NEGATIVE  Wet prep, genital     Status: Abnormal   Collection Time: 12/20/14 11:25 PM  Result Value Ref Range   Yeast Wet Prep HPF POC NONE SEEN NONE SEEN   Trich, Wet Prep NONE SEEN NONE SEEN   Clue Cells Wet Prep HPF POC FEW (A) NONE SEEN   WBC, Wet Prep HPF POC MODERATE (A) NONE SEEN    2340 Ibuprofen 600 mg PO Assessment and Plan  Bacterial Vaginosis Pelvic Pain   Plan: Discharge to home Outpatient pelvic ultrasound RX Ibuprofen 600 mg PO  RX Flagyl 500 mg PO BID x 7 days GC/CT pending HIV/RPR pending  Gwen Pounds, CNM 12/20/2014 11:45 PM

## 2014-12-21 LAB — GC/CHLAMYDIA PROBE AMP (~~LOC~~) NOT AT ARMC
Chlamydia: NEGATIVE
NEISSERIA GONORRHEA: NEGATIVE

## 2014-12-21 LAB — HIV ANTIBODY (ROUTINE TESTING W REFLEX): HIV SCREEN 4TH GENERATION: NONREACTIVE

## 2014-12-21 LAB — RPR: RPR: NONREACTIVE

## 2014-12-23 ENCOUNTER — Ambulatory Visit (HOSPITAL_COMMUNITY): Payer: Medicaid Other

## 2014-12-23 ENCOUNTER — Encounter (HOSPITAL_COMMUNITY): Payer: Self-pay | Admitting: Emergency Medicine

## 2014-12-23 ENCOUNTER — Emergency Department (HOSPITAL_COMMUNITY)
Admission: EM | Admit: 2014-12-23 | Discharge: 2014-12-23 | Disposition: A | Discharge status: Home / Self Care | Payer: Medicaid Other | Attending: Emergency Medicine | Admitting: Emergency Medicine

## 2014-12-23 ENCOUNTER — Emergency Department (HOSPITAL_COMMUNITY): Payer: Medicaid Other

## 2014-12-23 DIAGNOSIS — Z792 Long term (current) use of antibiotics: Secondary | ICD-10-CM | POA: Diagnosis not present

## 2014-12-23 DIAGNOSIS — Z872 Personal history of diseases of the skin and subcutaneous tissue: Secondary | ICD-10-CM | POA: Diagnosis not present

## 2014-12-23 DIAGNOSIS — N12 Tubulo-interstitial nephritis, not specified as acute or chronic: Secondary | ICD-10-CM | POA: Insufficient documentation

## 2014-12-23 DIAGNOSIS — Z3202 Encounter for pregnancy test, result negative: Secondary | ICD-10-CM | POA: Insufficient documentation

## 2014-12-23 DIAGNOSIS — Z8742 Personal history of other diseases of the female genital tract: Secondary | ICD-10-CM | POA: Diagnosis not present

## 2014-12-23 DIAGNOSIS — M545 Low back pain: Secondary | ICD-10-CM | POA: Diagnosis present

## 2014-12-23 DIAGNOSIS — Z862 Personal history of diseases of the blood and blood-forming organs and certain disorders involving the immune mechanism: Secondary | ICD-10-CM | POA: Diagnosis not present

## 2014-12-23 DIAGNOSIS — Z8679 Personal history of other diseases of the circulatory system: Secondary | ICD-10-CM | POA: Diagnosis not present

## 2014-12-23 DIAGNOSIS — Z79899 Other long term (current) drug therapy: Secondary | ICD-10-CM | POA: Diagnosis not present

## 2014-12-23 DIAGNOSIS — Z87440 Personal history of urinary (tract) infections: Secondary | ICD-10-CM | POA: Diagnosis not present

## 2014-12-23 DIAGNOSIS — Z8619 Personal history of other infectious and parasitic diseases: Secondary | ICD-10-CM | POA: Diagnosis not present

## 2014-12-23 LAB — CBC
HEMATOCRIT: 36.3 % (ref 36.0–46.0)
HEMOGLOBIN: 12.1 g/dL (ref 12.0–15.0)
MCH: 29.3 pg (ref 26.0–34.0)
MCHC: 33.3 g/dL (ref 30.0–36.0)
MCV: 87.9 fL (ref 78.0–100.0)
Platelets: 218 10*3/uL (ref 150–400)
RBC: 4.13 MIL/uL (ref 3.87–5.11)
RDW: 13.2 % (ref 11.5–15.5)
WBC: 5.4 10*3/uL (ref 4.0–10.5)

## 2014-12-23 LAB — BASIC METABOLIC PANEL
ANION GAP: 8 (ref 5–15)
BUN: 10 mg/dL (ref 6–20)
CHLORIDE: 102 mmol/L (ref 101–111)
CO2: 25 mmol/L (ref 22–32)
Calcium: 9.2 mg/dL (ref 8.9–10.3)
Creatinine, Ser: 0.71 mg/dL (ref 0.44–1.00)
GFR calc Af Amer: >60 mL/min (ref >60)
GLUCOSE: 93 mg/dL (ref 65–99)
POTASSIUM: 3.3 mmol/L — AB (ref 3.5–5.1)
Sodium: 135 mmol/L (ref 135–145)

## 2014-12-23 LAB — URINALYSIS, ROUTINE W REFLEX MICROSCOPIC
Bilirubin Urine: NEGATIVE
Glucose, UA: NEGATIVE mg/dL
HGB URINE DIPSTICK: NEGATIVE
Ketones, ur: 15 mg/dL — AB
NITRITE: NEGATIVE
PROTEIN: NEGATIVE mg/dL
SPECIFIC GRAVITY, URINE: 1.023 (ref 1.005–1.030)
UROBILINOGEN UA: 0.2 mg/dL (ref 0.0–1.0)
pH: 6 (ref 5.0–8.0)

## 2014-12-23 LAB — I-STAT BETA HCG BLOOD, ED (MC, WL, AP ONLY): I-stat hCG, quantitative: <5 m[IU]/mL (ref <5)

## 2014-12-23 LAB — URINE MICROSCOPIC-ADD ON

## 2014-12-23 LAB — I-STAT TROPONIN, ED: Troponin i, poc: 0 ng/mL (ref 0.00–0.08)

## 2014-12-23 MED ORDER — ONDANSETRON 4 MG PO TBDP: ORAL_TABLET | ORAL | Status: DC

## 2014-12-23 MED ORDER — CEPHALEXIN 500 MG PO CAPS: 500.0000 mg | ORAL_CAPSULE | Freq: Two times a day (BID) | ORAL | Status: DC

## 2014-12-23 MED ORDER — CEPHALEXIN 500 MG PO CAPS
500.0000 mg | ORAL_CAPSULE | Freq: Once | ORAL | Status: AC
  Administered 2014-12-23: 500 mg via ORAL
  Filled 2014-12-23: qty 1

## 2014-12-23 NOTE — Discharge Instructions (Signed)
Pyelonephritis, Adult °Pyelonephritis is a kidney infection. In general, there are 2 main types of pyelonephritis: °· Infections that come on quickly without any warning (acute pyelonephritis). °· Infections that persist for a long period of time (chronic pyelonephritis). °CAUSES  °Two main causes of pyelonephritis are: °· Bacteria traveling from the bladder to the kidney. This is a problem especially in pregnant women. The urine in the bladder can become filled with bacteria from multiple causes, including: °¨ Inflammation of the prostate gland (prostatitis). °¨ Sexual intercourse in females. °¨ Bladder infection (cystitis). °· Bacteria traveling from the bloodstream to the tissue part of the kidney. °Problems that may increase your risk of getting a kidney infection include: °· Diabetes. °· Kidney stones or bladder stones. °· Cancer. °· Catheters placed in the bladder. °· Other abnormalities of the kidney or ureter. °SYMPTOMS  °· Abdominal pain. °· Pain in the side or flank area. °· Fever. °· Chills. °· Upset stomach. °· Blood in the urine (dark urine). °· Frequent urination. °· Strong or persistent urge to urinate. °· Burning or stinging when urinating. °DIAGNOSIS  °Your caregiver may diagnose your kidney infection based on your symptoms. A urine sample may also be taken. °TREATMENT  °In general, treatment depends on how severe the infection is.  °· If the infection is mild and caught early, your caregiver may treat you with oral antibiotics and send you home. °· If the infection is more severe, the bacteria may have gotten into the bloodstream. This will require intravenous (IV) antibiotics and a hospital stay. Symptoms may include: °¨ High fever. °¨ Severe flank pain. °¨ Shaking chills. °· Even after a hospital stay, your caregiver may require you to be on oral antibiotics for a period of time. °· Other treatments may be required depending upon the cause of the infection. °HOME CARE INSTRUCTIONS  °· Take your  antibiotics as directed. Finish them even if you start to feel better. °· Make an appointment to have your urine checked to make sure the infection is gone. °· Drink enough fluids to keep your urine clear or pale yellow. °· Take medicines for the bladder if you have urgency and frequency of urination as directed by your caregiver. °SEEK IMMEDIATE MEDICAL CARE IF:  °· You have a fever or persistent symptoms for more than 2-3 days. °· You have a fever and your symptoms suddenly get worse. °· You are unable to take your antibiotics or fluids. °· You develop shaking chills. °· You experience extreme weakness or fainting. °· There is no improvement after 2 days of treatment. °MAKE SURE YOU: °· Understand these instructions. °· Will watch your condition. °· Will get help right away if you are not doing well or get worse. °Document Released: 04/10/2005 Document Revised: 10/10/2011 Document Reviewed: 09/14/2010 °[employer]® Patient Information ©2015 [employer]. This information is not intended to replace advice given to you by your health care provider. Make sure you discuss any questions you have with your health care provider. ° °

## 2014-12-23 NOTE — ED Notes (Signed)
Per pt, states she was placed on flagyl for BV-states she has not gotten better and thought it was a reaction to antibiotic

## 2014-12-23 NOTE — ED Provider Notes (Signed)
CSN: [id number]     Arrival date & time 12/23/14  1420 History   First MD Initiated Contact with Patient 12/23/14 1554     Chief Complaint  Patient presents with  . Back Pain  . multiple complaints      (Consider location/radiation/quality/duration/timing/severity/associated sxs/prior Treatment) Patient is a 29 y.o. female presenting with general illness.  Illness Location:  Generalized Quality:  Myalgias, soreness Severity:  Moderate Onset quality:  Gradual Duration:  1 day Timing:  Constant Progression:  Worsening Chronicity:  Recurrent Context:  No sick contacts, recently seen for BV by womens, placed on flagyl Relieved by:  Nothing Worsened by:  Nothing Associated symptoms: myalgias and nausea   Associated symptoms: no diarrhea, no fever, no shortness of breath and no vomiting   Associated symptoms comment:  Dysuria   Past Medical History  Diagnosis Date  . Anemia   . Heavy periods   . Bilateral ovarian cysts     resolve without intervention  . Trichomonas   . Urinary tract infection   . Migraine   . Eczema    Past Surgical History  Procedure Laterality Date  . Induced abortion      TABs x 5  . Tooth extraction     Family History  Problem Relation Age of Onset  . Hypertension Other   . Diabetes Other    Social History  Substance Use Topics  . Smoking status: Never Smoker   . Smokeless tobacco: Never Used  . Alcohol Use: Yes     Comment: social drinker   OB History    Gravida Para Term Preterm AB TAB SAB Ectopic Multiple Living   6 1 1  0 5 5 0 0 0 1     Review of Systems  Constitutional: Negative for fever.  Respiratory: Negative for shortness of breath.   Gastrointestinal: Positive for nausea. Negative for vomiting and diarrhea.  Musculoskeletal: Positive for myalgias.  All other systems reviewed and are negative.     Allergies  Review of patient's allergies indicates no known allergies.  Home Medications   Prior to Admission  medications   Medication Sig Start Date End Date Taking? Authorizing Provider  ibuprofen (ADVIL,MOTRIN) 600 MG tablet Take 1 tablet (600 mg total) by mouth once. 12/20/14  Yes Gwen Pounds, CNM  metroNIDAZOLE (FLAGYL) 500 MG tablet Take 1 tablet (500 mg total) by mouth 2 (two) times daily. Patient taking differently: Take 500 mg by mouth 2 (two) times daily. For 7 days 12-21-14 to 12-28-14 12/20/14  Yes Gwen Pounds, CNM  Multiple Vitamin (MULTIVITAMIN WITH MINERALS) TABS tablet Take 1 tablet by mouth daily.   Yes Historical Provider, MD  cephALEXin (KEFLEX) 500 MG capsule Take 1 capsule (500 mg total) by mouth 2 (two) times daily. 12/23/14   Debby Freiberg, MD  ondansetron (ZOFRAN ODT) 4 MG disintegrating tablet 4mg  ODT q4 hours prn nausea/vomit 12/23/14   Debby Freiberg, MD   BP 123/75 mmHg  Pulse 92  Temp(Src) 98.9 F (37.2 C) (Oral)  Resp 15  SpO2 99%  LMP 12/07/2014 Physical Exam  Constitutional: She is oriented to person, place, and time. She appears well-developed and well-nourished.  HENT:  Head: Normocephalic and atraumatic.  Right Ear: External ear normal.  Left Ear: External ear normal.  Eyes: Conjunctivae and EOM are normal. Pupils are equal, round, and reactive to light.  Neck: Normal range of motion. Neck supple.  Cardiovascular: Normal rate, regular rhythm, normal heart sounds and intact distal pulses.  Pulmonary/Chest: Effort normal and breath sounds normal.  Abdominal: Soft. Bowel sounds are normal. There is no tenderness. There is no CVA tenderness.  Musculoskeletal: Normal range of motion.  Neurological: She is alert and oriented to person, place, and time.  Skin: Skin is warm and dry.  Vitals reviewed.   ED Course  Procedures (including critical care time) Labs Review Labs Reviewed  BASIC METABOLIC PANEL - Abnormal; Notable for the following:    Potassium 3.3 (*)    All other components within normal limits  URINALYSIS, ROUTINE W REFLEX MICROSCOPIC (NOT AT  [facility]) - Abnormal; Notable for the following:    APPearance CLOUDY (*)    Ketones, ur 15 (*)    Leukocytes, UA LARGE (*)    All other components within normal limits  URINE MICROSCOPIC-ADD ON - Abnormal; Notable for the following:    Squamous Epithelial / LPF MANY (*)    Bacteria, UA FEW (*)    All other components within normal limits  CBC  I-STAT BETA HCG BLOOD, ED ([facility], [facility], [facility] ONLY)  I-STAT TROPOININ, ED    Imaging Review Dg Chest 2 View  12/23/2014   CLINICAL DATA:  Bilateral chest pain since last evening  EXAM: CHEST  2 VIEW  COMPARISON:  10/21/2014  FINDINGS: The cardiac silhouette, mediastinal and hilar contours are within normal limits and stable. The lungs are clear. No pleural effusion. The bony thorax is intact.  IMPRESSION: Normal chest x-ray.   Electronically Signed   By: Marijo Sanes M.D.   On: 12/23/2014 14:52   I have personally reviewed and evaluated these images and lab results as part of my medical decision-making.   EKG Interpretation   Date/Time:  Wednesday December 23 2014 14:27:55 EDT Ventricular Rate:  84 PR Interval:  160 QRS Duration: 67 QT Interval:  467 QTC Calculation: 552 R Axis:   27 Text Interpretation:  Sinus rhythm Probable left atrial enlargement Low  voltage, precordial leads Probable anteroseptal infarct, old Borderline T  abnormalities, inferior leads Prolonged QT interval Baseline wander in  lead(s) V5 No significant change since last tracing Confirmed by Debby Freiberg [phone]) on 12/23/2014 4:01:10 PM      MDM   Final diagnoses:  Pyelonephritis    29 y.o. female with pertinent PMH of prior UTI, recent BV presents with dysuria, myalgias beginning today. She was recently seen for vaginal discharge and prescribed Flagyl and has had improvement of symptoms. No fevers at home, however here febrile to 102.  Exam reassuring. No vomiting in the department. Workup revealed UTI. Likely early pyelonephritis. Discharged home with Keflex and  Zofran..    I have reviewed all laboratory and imaging studies if ordered as above  1. Pyelonephritis         Debby Freiberg, MD 12/23/14 [phone]

## 2015-01-15 ENCOUNTER — Emergency Department (HOSPITAL_COMMUNITY)
Admission: EM | Admit: 2015-01-15 | Discharge: 2015-01-15 | Disposition: A | Discharge status: Home / Self Care | Payer: Medicaid Other | Attending: Emergency Medicine | Admitting: Emergency Medicine

## 2015-01-15 ENCOUNTER — Encounter (HOSPITAL_COMMUNITY): Payer: Self-pay

## 2015-01-15 DIAGNOSIS — Z8619 Personal history of other infectious and parasitic diseases: Secondary | ICD-10-CM | POA: Insufficient documentation

## 2015-01-15 DIAGNOSIS — N739 Female pelvic inflammatory disease, unspecified: Secondary | ICD-10-CM | POA: Diagnosis not present

## 2015-01-15 DIAGNOSIS — Z8679 Personal history of other diseases of the circulatory system: Secondary | ICD-10-CM | POA: Insufficient documentation

## 2015-01-15 DIAGNOSIS — M545 Low back pain: Secondary | ICD-10-CM | POA: Diagnosis present

## 2015-01-15 DIAGNOSIS — Z3202 Encounter for pregnancy test, result negative: Secondary | ICD-10-CM | POA: Insufficient documentation

## 2015-01-15 DIAGNOSIS — Z872 Personal history of diseases of the skin and subcutaneous tissue: Secondary | ICD-10-CM | POA: Insufficient documentation

## 2015-01-15 DIAGNOSIS — R110 Nausea: Secondary | ICD-10-CM | POA: Diagnosis not present

## 2015-01-15 DIAGNOSIS — Z87440 Personal history of urinary (tract) infections: Secondary | ICD-10-CM | POA: Insufficient documentation

## 2015-01-15 DIAGNOSIS — Z862 Personal history of diseases of the blood and blood-forming organs and certain disorders involving the immune mechanism: Secondary | ICD-10-CM | POA: Insufficient documentation

## 2015-01-15 LAB — URINALYSIS, ROUTINE W REFLEX MICROSCOPIC
Bilirubin Urine: NEGATIVE
Glucose, UA: NEGATIVE mg/dL
HGB URINE DIPSTICK: NEGATIVE
Ketones, ur: NEGATIVE mg/dL
Nitrite: NEGATIVE
Protein, ur: NEGATIVE mg/dL
SPECIFIC GRAVITY, URINE: 1.017 (ref 1.005–1.030)
UROBILINOGEN UA: 0.2 mg/dL (ref 0.0–1.0)
pH: 6.5 (ref 5.0–8.0)

## 2015-01-15 LAB — COMPREHENSIVE METABOLIC PANEL
ALK PHOS: 57 U/L (ref 38–126)
ALT: 15 U/L (ref 14–54)
AST: 18 U/L (ref 15–41)
Albumin: 4.3 g/dL (ref 3.5–5.0)
Anion gap: 7 (ref 5–15)
BILIRUBIN TOTAL: 0.6 mg/dL (ref 0.3–1.2)
BUN: 10 mg/dL (ref 6–20)
CO2: 26 mmol/L (ref 22–32)
CREATININE: 0.73 mg/dL (ref 0.44–1.00)
Calcium: 9.1 mg/dL (ref 8.9–10.3)
Chloride: 103 mmol/L (ref 101–111)
GFR calc Af Amer: >60 mL/min (ref >60)
Glucose, Bld: 100 mg/dL — ABNORMAL HIGH (ref 65–99)
Potassium: 3.6 mmol/L (ref 3.5–5.1)
Sodium: 136 mmol/L (ref 135–145)
TOTAL PROTEIN: 7 g/dL (ref 6.5–8.1)

## 2015-01-15 LAB — CBC WITH DIFFERENTIAL/PLATELET
BASOS ABS: 0 10*3/uL (ref 0.0–0.1)
Basophils Relative: 0 %
Eosinophils Absolute: 0 10*3/uL (ref 0.0–0.7)
Eosinophils Relative: 0 %
HEMATOCRIT: 33.3 % — AB (ref 36.0–46.0)
Hemoglobin: 11.2 g/dL — ABNORMAL LOW (ref 12.0–15.0)
LYMPHS PCT: 27 %
Lymphs Abs: 1.5 10*3/uL (ref 0.7–4.0)
MCH: 29.7 pg (ref 26.0–34.0)
MCHC: 33.6 g/dL (ref 30.0–36.0)
MCV: 88.3 fL (ref 78.0–100.0)
MONO ABS: 0.4 10*3/uL (ref 0.1–1.0)
Monocytes Relative: 8 %
NEUTROS ABS: 3.5 10*3/uL (ref 1.7–7.7)
Neutrophils Relative %: 65 %
Platelets: 256 10*3/uL (ref 150–400)
RBC: 3.77 MIL/uL — AB (ref 3.87–5.11)
RDW: 13.4 % (ref 11.5–15.5)
WBC: 5.4 10*3/uL (ref 4.0–10.5)

## 2015-01-15 LAB — URINE MICROSCOPIC-ADD ON

## 2015-01-15 LAB — WET PREP, GENITAL
Clue Cells Wet Prep HPF POC: NONE SEEN
Trich, Wet Prep: NONE SEEN

## 2015-01-15 LAB — PREGNANCY, URINE: PREG TEST UR: NEGATIVE

## 2015-01-15 MED ORDER — CEFTRIAXONE SODIUM 250 MG IJ SOLR
250.0000 mg | Freq: Once | INTRAMUSCULAR | Status: AC
  Administered 2015-01-15: 250 mg via INTRAMUSCULAR
  Filled 2015-01-15: qty 250

## 2015-01-15 MED ORDER — DOXYCYCLINE HYCLATE 100 MG PO CAPS: 100.0000 mg | ORAL_CAPSULE | Freq: Two times a day (BID) | ORAL | Status: DC

## 2015-01-15 MED ORDER — TRAMADOL HCL 50 MG PO TABS: 50.0000 mg | ORAL_TABLET | Freq: Four times a day (QID) | ORAL | Status: DC | PRN

## 2015-01-15 MED ORDER — LIDOCAINE HCL 1 % IJ SOLN
INTRAMUSCULAR | Status: AC
  Administered 2015-01-15: 0.9 mL
  Filled 2015-01-15: qty 20

## 2015-01-15 NOTE — ED Provider Notes (Signed)
CSN: [id number]     Arrival date & time 01/15/15  3154 History   First MD Initiated Contact with Patient 01/15/15 [phone]     Chief Complaint  Patient presents with  . Back Pain    (Consider location/radiation/quality/duration/timing/severity/associated sxs/prior Treatment) HPI Comments: 29 year old female with a history of bilateral ovarian cysts, ecchymosis, and migraine headaches presents to the emergency department for evaluation of back pain. Patient states that she has felt pain in her bilateral low back intermittently over the past few weeks. She states that she was evaluated 3 weeks ago for same and diagnosed with pyelonephritis. She took her full course of antibiotics, but symptoms never fully resolved. She states that she has been feeling the urge to urinate more frequently with associated urinary frequency. She has had no burning dysuria or hematuria. Patient states that she feels pain more prior to urination. She denies taking any medications for symptoms other than the Keflex she was prescribed following ED evaluation in August. Patient denies any fever since she was last seen. She has had no chest pain, shortness of breath, vomiting, diarrhea, melena or hematochezia, vaginal bleeding or discharge, or hx of abdominal surgeries. LMP 01/03/15.  Patient is a 29 y.o. female presenting with back pain. The history is provided by the patient. No language interpreter was used.  Back Pain Associated symptoms: abdominal pain   Associated symptoms: no dysuria and no fever     Past Medical History  Diagnosis Date  . Anemia   . Heavy periods   . Bilateral ovarian cysts     resolve without intervention  . Trichomonas   . Urinary tract infection   . Migraine   . Eczema    Past Surgical History  Procedure Laterality Date  . Induced abortion      TABs x 5  . Tooth extraction     Family History  Problem Relation Age of Onset  . Hypertension Other   . Diabetes Other    Social History   Substance Use Topics  . Smoking status: Never Smoker   . Smokeless tobacco: Never Used  . Alcohol Use: Yes     Comment: social drinker   OB History    Gravida Para Term Preterm AB TAB SAB Ectopic Multiple Living   6 1 1  0 5 5 0 0 0 1      Review of Systems  Constitutional: Negative for fever.  Gastrointestinal: Positive for nausea and abdominal pain. Negative for vomiting.  Genitourinary: Positive for urgency and frequency. Negative for dysuria, hematuria, vaginal bleeding and vaginal discharge.  Musculoskeletal: Positive for back pain.  All other systems reviewed and are negative.   Allergies  Review of patient's allergies indicates no known allergies.  Home Medications   Prior to Admission medications   Medication Sig Start Date End Date Taking? Authorizing Provider  Multiple Vitamin (MULTIVITAMIN WITH MINERALS) TABS tablet Take 1 tablet by mouth daily.   Yes Historical Provider, MD  cephALEXin (KEFLEX) 500 MG capsule Take 1 capsule (500 mg total) by mouth 2 (two) times daily. Patient not taking: Reported on 01/15/2015 12/23/14   Debby Freiberg, MD  doxycycline (VIBRAMYCIN) 100 MG capsule Take 1 capsule (100 mg total) by mouth 2 (two) times daily. Take with a full glass of water/milk 01/15/15   Antonietta Breach, PA-C  ibuprofen (ADVIL,MOTRIN) 600 MG tablet Take 1 tablet (600 mg total) by mouth once. Patient not taking: Reported on 01/15/2015 12/20/14   Gwen Pounds, CNM  metroNIDAZOLE (FLAGYL) 500  MG tablet Take 1 tablet (500 mg total) by mouth 2 (two) times daily. Patient not taking: Reported on 01/15/2015 12/20/14   Gwen Pounds, CNM  ondansetron (ZOFRAN ODT) 4 MG disintegrating tablet 4mg  ODT q4 hours prn nausea/vomit Patient not taking: Reported on 01/15/2015 12/23/14   Debby Freiberg, MD  traMADol (ULTRAM) 50 MG tablet Take 1 tablet (50 mg total) by mouth every 6 (six) hours as needed. 01/15/15   Antonietta Breach, PA-C   BP 131/80 mmHg  Pulse 64  Temp(Src) 98.1 F (36.7 C) (Oral)   Resp 20  SpO2 97%  LMP 01/03/2015   Physical Exam  Constitutional: She is oriented to person, place, and time. She appears well-developed and well-nourished. No distress.  Nontoxic/nonseptic appearing  HENT:  Head: Normocephalic and atraumatic.  Eyes: Conjunctivae and EOM are normal. No scleral icterus.  Neck: Normal range of motion.  Cardiovascular: Normal rate, regular rhythm and intact distal pulses.   Pulmonary/Chest: Effort normal and breath sounds normal. No respiratory distress. She has no wheezes. She has no rales.  Respirations even and unlabored  Abdominal: Soft. She exhibits no distension. There is tenderness. There is no rebound and no guarding.  Mild tenderness to palpation in the right midabdomen. No masses or peritoneal signs. Abdomen soft.  Genitourinary: There is no rash, tenderness, lesion or injury on the right labia. There is no rash, tenderness, lesion or injury on the left labia. Uterus is not tender. Cervix exhibits friability. Cervix exhibits no motion tenderness. Right adnexum displays no mass, no tenderness and no fullness. Left adnexum displays no mass, no tenderness and no fullness. No bleeding in the vagina. Vaginal discharge (white, yellow, thick) found.  Musculoskeletal: Normal range of motion.  Neurological: She is alert and oriented to person, place, and time. She exhibits normal muscle tone. Coordination normal.  GCS 15. Patient moving all extremities.  Skin: Skin is warm and dry. No rash noted. She is not diaphoretic. No erythema. No pallor.  Psychiatric: She has a normal mood and affect. Her behavior is normal.  Nursing note and vitals reviewed.   ED Course  Procedures (including critical care time) Labs Review Labs Reviewed  WET PREP, GENITAL - Abnormal; Notable for the following:    Yeast Wet Prep HPF POC FEW (*)    WBC, Wet Prep HPF POC MANY (*)    All other components within normal limits  URINALYSIS, ROUTINE W REFLEX MICROSCOPIC (NOT AT  [facility]) - Abnormal; Notable for the following:    APPearance CLOUDY (*)    Leukocytes, UA LARGE (*)    All other components within normal limits  CBC WITH DIFFERENTIAL/PLATELET - Abnormal; Notable for the following:    RBC 3.77 (*)    Hemoglobin 11.2 (*)    HCT 33.3 (*)    All other components within normal limits  COMPREHENSIVE METABOLIC PANEL - Abnormal; Notable for the following:    Glucose, Bld 100 (*)    All other components within normal limits  URINE MICROSCOPIC-ADD ON - Abnormal; Notable for the following:    Squamous Epithelial / LPF FEW (*)    Bacteria, UA FEW (*)    All other components within normal limits  URINE CULTURE  PREGNANCY, URINE  GC/CHLAMYDIA PROBE AMP ([place]) NOT AT [facility]    Imaging Review No results found.   I have personally reviewed and evaluated these images and lab results as part of my medical decision-making.   EKG Interpretation None      MDM  Final diagnoses:  PID (pelvic inflammatory disease)    29 year old female presents to the emergency department for evaluation of persistent low back pain with associated urinary frequency and urgency. Patient has had no burning dysuria. She was treated 3 weeks ago with a 10 day course of Keflex for presumed pyelonephritis. Patient's urinalysis today shows pyuria with 11-20 white blood cells; however, patient has few bacteria and negative nitrites. An not convinced that patient has a urinary tract infection today. Urine sent for culture.  Wet prep shows many white blood cells as well. Laboratory workup is otherwise noncontributory. Patient is nontoxic and nonseptic appearing. She is also afebrile and hemodynamically stable. Suspect that symptoms may be secondary to PID. Will treat with IM shot of Rocephin and outpatient course of doxycycline. Gonorrhea and chlamydia cultures are pending. No clinical signs of acute surgical abdomen to suggest the need for emergent imaging. Patient to follow-up with her  OB/GYN on an outpatient basis. Return precautions discussed and provided. Patient agreeable to plan with no unaddressed concerns. Patient discharged in good condition.   Filed Vitals:   01/15/15 0340  BP: 131/80  Pulse: 64  Temp: 98.1 F (36.7 C)  TempSrc: Oral  Resp: 20  SpO2: 97%     Antonietta Breach, PA-C 01/15/15 7847  Carmin Muskrat, MD 01/15/15 [phone]

## 2015-01-15 NOTE — ED Notes (Signed)
Patient reports recent UTI, states she completed 10 days abx and ran low grade fever (less than 100) during that time. Patient states she has urinary frequency, c/o bilateral low abd pain that radiates into her flanks. PA at bedside.

## 2015-01-15 NOTE — ED Notes (Signed)
Pt continues to complain of low back pain and painful urination, already treated for a kidney infection but now has the same symptoms with frequency

## 2015-01-15 NOTE — Discharge Instructions (Signed)
Pelvic Inflammatory Disease Pelvic inflammatory disease (PID) refers to an infection in some or all of the female organs. The infection can be in the uterus, ovaries, fallopian tubes, or the surrounding tissues in the pelvis. PID can cause abdominal or pelvic pain that comes on suddenly (acute pelvic pain). PID is a serious infection because it can lead to lasting (chronic) pelvic pain or the inability to have children (infertile).  CAUSES  The infection is often caused by the normal bacteria found in the vaginal tissues. PID may also be caused by an infection that is spread during sexual contact. PID can also occur following:   The birth of a baby.   A miscarriage.   An abortion.   Major pelvic surgery.   The use of an intrauterine device (IUD).   A sexual assault.  RISK FACTORS Certain factors can put a person at higher risk for PID, such as:  Being younger than 25 years.  Being sexually active at [place] age.  Usingnonbarrier contraception.  Havingmultiple sexual partners.  Having sex with someone who has symptoms of a genital infection.  Using oral contraception. Other times, certain behaviors can increase the possibility of getting PID, such as:  Having sex during your period.  Using a vaginal douche.  Having an intrauterine device (IUD) in place. SYMPTOMS   Abdominal or pelvic pain.   Fever.   Chills.   Abnormal vaginal discharge.  Abnormal uterine bleeding.   Unusual pain shortly after finishing your period. DIAGNOSIS  Your caregiver will choose some of the following methods to make a diagnosis, such as:   Performinga physical exam and history. A pelvic exam typically reveals a very tender uterus and surrounding pelvis.   Ordering laboratory tests including a pregnancy test, blood tests, and urine test.  Orderingcultures of the vagina and cervix to check for a sexually transmitted infection (STI).  Performing an ultrasound.    Performing a laparoscopic procedure to look inside the pelvis.  TREATMENT   Antibiotic medicines may be prescribed and taken by mouth.   Sexual partners may be treated when the infection is caused by a sexually transmitted disease (STD).   Hospitalization may be needed to give antibiotics intravenously.  Surgery may be needed, but this is rare. It may take weeks until you are completely well. If you are diagnosed with PID, you should also be checked for human immunodeficiency virus (HIV). HOME CARE INSTRUCTIONS   If given, take your antibiotics as directed. Finish the medicine even if you start to feel better.   Only take over-the-counter or prescription medicines for pain, discomfort, or fever as directed by your caregiver.   Do not have sexual intercourse until treatment is completed or as directed by your caregiver. If PID is confirmed, your recent sexual partner(s) will need treatment.   Keep your follow-up appointments. SEEK MEDICAL CARE IF:   You have increased or abnormal vaginal discharge.   You need prescription medicine for your pain.   You vomit.   You cannot take your medicines.   Your partner has an STD.  SEEK IMMEDIATE MEDICAL CARE IF:   You have a fever.   You have increased abdominal or pelvic pain.   You have chills.   You have pain when you urinate.   You are not better after 72 hours following treatment.  MAKE SURE YOU:   Understand these instructions.  Will watch your condition.  Will get help right away if you are not doing well or get worse.  Document Released: 04/10/2005 Document Revised: 08/05/2012 Document Reviewed: 04/06/2011 [facility] Patient Information 2015 [place], [place]. This information is not intended to replace advice given to you by your health care provider. Make sure you discuss any questions you have with your health care provider.

## 2015-01-16 LAB — URINE CULTURE

## 2015-01-18 LAB — GC/CHLAMYDIA PROBE AMP (~~LOC~~) NOT AT ARMC
Chlamydia: NEGATIVE
Neisseria Gonorrhea: NEGATIVE

## 2015-01-23 ENCOUNTER — Emergency Department (HOSPITAL_COMMUNITY)
Admission: EM | Admit: 2015-01-23 | Discharge: 2015-01-24 | Disposition: A | Discharge status: Home / Self Care | Payer: Medicaid Other | Attending: Emergency Medicine | Admitting: Emergency Medicine

## 2015-01-23 ENCOUNTER — Encounter (HOSPITAL_COMMUNITY): Payer: Self-pay | Admitting: Emergency Medicine

## 2015-01-23 DIAGNOSIS — Z8679 Personal history of other diseases of the circulatory system: Secondary | ICD-10-CM | POA: Diagnosis not present

## 2015-01-23 DIAGNOSIS — Z792 Long term (current) use of antibiotics: Secondary | ICD-10-CM | POA: Insufficient documentation

## 2015-01-23 DIAGNOSIS — Z872 Personal history of diseases of the skin and subcutaneous tissue: Secondary | ICD-10-CM | POA: Insufficient documentation

## 2015-01-23 DIAGNOSIS — R109 Unspecified abdominal pain: Secondary | ICD-10-CM | POA: Diagnosis present

## 2015-01-23 DIAGNOSIS — R197 Diarrhea, unspecified: Secondary | ICD-10-CM | POA: Insufficient documentation

## 2015-01-23 DIAGNOSIS — Z79899 Other long term (current) drug therapy: Secondary | ICD-10-CM | POA: Insufficient documentation

## 2015-01-23 DIAGNOSIS — N390 Urinary tract infection, site not specified: Secondary | ICD-10-CM | POA: Insufficient documentation

## 2015-01-23 DIAGNOSIS — Z8619 Personal history of other infectious and parasitic diseases: Secondary | ICD-10-CM | POA: Insufficient documentation

## 2015-01-23 DIAGNOSIS — R531 Weakness: Secondary | ICD-10-CM | POA: Insufficient documentation

## 2015-01-23 DIAGNOSIS — R110 Nausea: Secondary | ICD-10-CM | POA: Diagnosis not present

## 2015-01-23 DIAGNOSIS — Z862 Personal history of diseases of the blood and blood-forming organs and certain disorders involving the immune mechanism: Secondary | ICD-10-CM | POA: Insufficient documentation

## 2015-01-23 DIAGNOSIS — Z3202 Encounter for pregnancy test, result negative: Secondary | ICD-10-CM | POA: Diagnosis not present

## 2015-01-23 LAB — CBC WITH DIFFERENTIAL/PLATELET
BASOS ABS: 0 10*3/uL (ref 0.0–0.1)
Basophils Relative: 0 %
EOS PCT: 2 %
Eosinophils Absolute: 0.1 10*3/uL (ref 0.0–0.7)
HEMATOCRIT: 32.9 % — AB (ref 36.0–46.0)
Hemoglobin: 10.8 g/dL — ABNORMAL LOW (ref 12.0–15.0)
LYMPHS ABS: 1.6 10*3/uL (ref 0.7–4.0)
LYMPHS PCT: 34 %
MCH: 28.5 pg (ref 26.0–34.0)
MCHC: 32.8 g/dL (ref 30.0–36.0)
MCV: 86.8 fL (ref 78.0–100.0)
Monocytes Absolute: 0.4 10*3/uL (ref 0.1–1.0)
Monocytes Relative: 9 %
NEUTROS ABS: 2.6 10*3/uL (ref 1.7–7.7)
Neutrophils Relative %: 55 %
PLATELETS: 214 10*3/uL (ref 150–400)
RBC: 3.79 MIL/uL — AB (ref 3.87–5.11)
RDW: 13.3 % (ref 11.5–15.5)
WBC: 4.7 10*3/uL (ref 4.0–10.5)

## 2015-01-23 NOTE — ED Notes (Addendum)
Pt complains of flank pain and was seen here last week with diagnosis of PID. Pt lost her  Doxycycline and only half of perscribed dosage. Pt also complains of nausea, diarrhea, and weakness.

## 2015-01-23 NOTE — ED Provider Notes (Signed)
CSN: [id number]     Arrival date & time 01/23/15  2234 History  By signing my name below, I, Erling Conte, attest that this documentation has been prepared under the direction and in the presence of Quincy Carnes, PA-C Electronically Signed: Erling Conte, ED Scribe. 01/23/2015. 11:13 PM.    Chief Complaint  Patient presents with  . Flank Pain   The history is provided by the patient. No language interpreter was used.    HPI Comments: Chelsea Reed is a 29 y.o. female with a h/o bilateral ovarian cysts, ecchymosis, and migraine headaches who presents to the Emergency Department complaining of right flank pain intermittently for the past several weeks. She reports associated odorous urine, dysuria and intermittent difficulty voiding urine. Pt was seen on 01/15/15 for the same and was dx with PID and given rx for doxycycline which she has been complaint with. Pt states that she misplaced her doxycycline rx yesterday and had only taken half of her prescribed dosage. States she did not feel any relief while taking doxycycline. At pt visit on 01/15/15 her urine was sent for culture which came back positive for multiple species. She denies any current pelvic pain, vaginal discharge, fever, chills, nausea or vomiting.  VSS.  Past Medical History  Diagnosis Date  . Anemia   . Heavy periods   . Bilateral ovarian cysts     resolve without intervention  . Trichomonas   . Urinary tract infection   . Migraine   . Eczema    Past Surgical History  Procedure Laterality Date  . Induced abortion      TABs x 5  . Tooth extraction     Family History  Problem Relation Age of Onset  . Hypertension Other   . Diabetes Other    Social History  Substance Use Topics  . Smoking status: Never Smoker   . Smokeless tobacco: Never Used  . Alcohol Use: Yes     Comment: social drinker   OB History    Gravida Para Term Preterm AB TAB SAB Ectopic Multiple Living   6 1 1  0 5 5 0 0 0 1     Review of  Systems  Constitutional: Negative for fever and chills.  Gastrointestinal: Negative for nausea and vomiting.  Genitourinary: Positive for dysuria, flank pain and difficulty urinating. Negative for vaginal discharge, vaginal pain and pelvic pain.  All other systems reviewed and are negative.     Allergies  Review of patient's allergies indicates no known allergies.  Home Medications   Prior to Admission medications   Medication Sig Start Date End Date Taking? Authorizing Provider  cephALEXin (KEFLEX) 500 MG capsule Take 1 capsule (500 mg total) by mouth 2 (two) times daily. Patient not taking: Reported on 01/15/2015 12/23/14   Debby Freiberg, MD  doxycycline (VIBRAMYCIN) 100 MG capsule Take 1 capsule (100 mg total) by mouth 2 (two) times daily. Take with a full glass of water/milk 01/15/15   Antonietta Breach, PA-C  ibuprofen (ADVIL,MOTRIN) 600 MG tablet Take 1 tablet (600 mg total) by mouth once. Patient not taking: Reported on 01/15/2015 12/20/14   Gwen Pounds, CNM  metroNIDAZOLE (FLAGYL) 500 MG tablet Take 1 tablet (500 mg total) by mouth 2 (two) times daily. Patient not taking: Reported on 01/15/2015 12/20/14   Gwen Pounds, CNM  Multiple Vitamin (MULTIVITAMIN WITH MINERALS) TABS tablet Take 1 tablet by mouth daily.    Historical Provider, MD  ondansetron (ZOFRAN ODT) 4 MG disintegrating tablet 4mg   ODT q4 hours prn nausea/vomit Patient not taking: Reported on 01/15/2015 12/23/14   Debby Freiberg, MD  traMADol (ULTRAM) 50 MG tablet Take 1 tablet (50 mg total) by mouth every 6 (six) hours as needed. 01/15/15   Antonietta Breach, PA-C   Triage Vitals: BP 126/80 mmHg  Pulse 72  Temp(Src) 98.3 F (36.8 C) (Oral)  Resp 16  Ht 5\' 5"  (1.651 m)  Wt 170 lb (77.111 kg)  BMI 28.29 kg/m2  SpO2 98%  LMP 01/03/2015  Physical Exam  Constitutional: She is oriented to person, place, and time. She appears well-developed and well-nourished. No distress.  HENT:  Head: Normocephalic and atraumatic.   Mouth/Throat: Oropharynx is clear and moist.  Eyes: Conjunctivae and EOM are normal. Pupils are equal, round, and reactive to light.  Neck: Normal range of motion. Neck supple.  Cardiovascular: Normal rate, regular rhythm and normal heart sounds.   Pulmonary/Chest: Effort normal and breath sounds normal. No respiratory distress. She has no wheezes.  Abdominal: Soft. Bowel sounds are normal. There is no tenderness. There is CVA tenderness (Right). There is no guarding.  Musculoskeletal: Normal range of motion.  Neurological: She is alert and oriented to person, place, and time.  Skin: Skin is warm and dry. She is not diaphoretic.  Psychiatric: She has a normal mood and affect.  Nursing note and vitals reviewed.   ED Course  Procedures (including critical care time)  DIAGNOSTIC STUDIES: Oxygen Saturation is 98% on RA, normal by my interpretation.    COORDINATION OF CARE:  11:12 PM- Will order UA, urine culture, POC u-preg, CBC w/ diff and BMP.  Pt advised of plan for treatment and pt agrees.  Labs Review Labs Reviewed  URINALYSIS, ROUTINE W REFLEX MICROSCOPIC (NOT AT [facility]) - Abnormal; Notable for the following:    APPearance CLOUDY (*)    Leukocytes, UA SMALL (*)    All other components within normal limits  CBC WITH DIFFERENTIAL/PLATELET - Abnormal; Notable for the following:    RBC 3.79 (*)    Hemoglobin 10.8 (*)    HCT 32.9 (*)    All other components within normal limits  BASIC METABOLIC PANEL - Abnormal; Notable for the following:    Potassium 3.3 (*)    Glucose, Bld 107 (*)    Calcium 8.7 (*)    All other components within normal limits  URINE MICROSCOPIC-ADD ON - Abnormal; Notable for the following:    Squamous Epithelial / LPF FEW (*)    All other components within normal limits  URINE CULTURE  POC URINE PREG, ED    Imaging Review No results found.    EKG Interpretation None      MDM   Final diagnoses:  Flank pain  UTI (lower urinary tract infection)    29 year old female here with right flank pain, nausea, diarrhea, and generalized weakness. Patient is afebrile, nontoxic. She has right CVA tenderness, remainder of abdominal exam is benign. She states she was started on antibiotics for PID 2 weeks ago, however she never had any pelvic pain, vaginal discharge, or concern for STD. She states she has recurrent UTIs and continues having some dysuria. Labwork as above, no leukocytosis.  UA appears infectious. Patient does have history of recurrent UTI, most recent culture revealed multiple organisms. Will send urine today for culture as well. Will start on keflex for UTI/possible early pyelonephritis as patient states she responded to this well in the past.  Given that patient has no pelvic symptoms, had no relief from  doxycycline, and all cultures from last visit were negative, do not feel she additional treatment for PID.  She has scheduled FU with her gynecologist next week.  Also given referral to urology given recurrent UTI/urinary symptoms.  Discussed plan with patient, he/she acknowledged understanding and agreed with plan of care.  Return precautions given for new or worsening symptoms.  I personally performed the services described in this documentation, which was scribed in my presence. The recorded information has been reviewed and is accurate.  Larene Pickett, PA-C 01/24/15 0459  Lacretia Leigh, MD 01/26/15 [phone]

## 2015-01-24 LAB — BASIC METABOLIC PANEL
ANION GAP: 6 (ref 5–15)
BUN: 9 mg/dL (ref 6–20)
CALCIUM: 8.7 mg/dL — AB (ref 8.9–10.3)
CO2: 24 mmol/L (ref 22–32)
Chloride: 107 mmol/L (ref 101–111)
Creatinine, Ser: 0.71 mg/dL (ref 0.44–1.00)
GFR calc Af Amer: >60 mL/min (ref >60)
GLUCOSE: 107 mg/dL — AB (ref 65–99)
POTASSIUM: 3.3 mmol/L — AB (ref 3.5–5.1)
SODIUM: 137 mmol/L (ref 135–145)

## 2015-01-24 LAB — URINE MICROSCOPIC-ADD ON

## 2015-01-24 LAB — URINALYSIS, ROUTINE W REFLEX MICROSCOPIC
BILIRUBIN URINE: NEGATIVE
Glucose, UA: NEGATIVE mg/dL
HGB URINE DIPSTICK: NEGATIVE
Ketones, ur: NEGATIVE mg/dL
NITRITE: NEGATIVE
PROTEIN: NEGATIVE mg/dL
SPECIFIC GRAVITY, URINE: 1.02 (ref 1.005–1.030)
UROBILINOGEN UA: 0.2 mg/dL (ref 0.0–1.0)
pH: 5.5 (ref 5.0–8.0)

## 2015-01-24 LAB — POC URINE PREG, ED: PREG TEST UR: NEGATIVE

## 2015-01-24 MED ORDER — CEPHALEXIN 500 MG PO CAPS: 500.0000 mg | ORAL_CAPSULE | Freq: Four times a day (QID) | ORAL | Status: DC

## 2015-01-24 MED ORDER — PHENAZOPYRIDINE HCL 200 MG PO TABS: 200.0000 mg | ORAL_TABLET | Freq: Three times a day (TID) | ORAL | Status: DC | PRN

## 2015-01-24 NOTE — Discharge Instructions (Signed)
Take the prescribed medication as directed. Follow-up with urology for further evaluation/recommendations regarding frequent UTI's. Return to the ED for new or worsening symptoms.

## 2015-01-25 LAB — URINE CULTURE

## 2015-02-15 ENCOUNTER — Emergency Department (HOSPITAL_COMMUNITY)
Admission: EM | Admit: 2015-02-15 | Discharge: 2015-02-15 | Disposition: A | Discharge status: Home / Self Care | Payer: Medicaid Other | Attending: Emergency Medicine | Admitting: Emergency Medicine

## 2015-02-15 ENCOUNTER — Encounter (HOSPITAL_COMMUNITY): Payer: Self-pay | Admitting: Emergency Medicine

## 2015-02-15 DIAGNOSIS — Z8619 Personal history of other infectious and parasitic diseases: Secondary | ICD-10-CM | POA: Insufficient documentation

## 2015-02-15 DIAGNOSIS — Z872 Personal history of diseases of the skin and subcutaneous tissue: Secondary | ICD-10-CM | POA: Diagnosis not present

## 2015-02-15 DIAGNOSIS — Z8679 Personal history of other diseases of the circulatory system: Secondary | ICD-10-CM | POA: Diagnosis not present

## 2015-02-15 DIAGNOSIS — N390 Urinary tract infection, site not specified: Secondary | ICD-10-CM | POA: Insufficient documentation

## 2015-02-15 DIAGNOSIS — Z8742 Personal history of other diseases of the female genital tract: Secondary | ICD-10-CM | POA: Diagnosis not present

## 2015-02-15 DIAGNOSIS — Z862 Personal history of diseases of the blood and blood-forming organs and certain disorders involving the immune mechanism: Secondary | ICD-10-CM | POA: Insufficient documentation

## 2015-02-15 DIAGNOSIS — R300 Dysuria: Secondary | ICD-10-CM | POA: Diagnosis present

## 2015-02-15 LAB — URINE MICROSCOPIC-ADD ON

## 2015-02-15 LAB — URINALYSIS, ROUTINE W REFLEX MICROSCOPIC
BILIRUBIN URINE: NEGATIVE
GLUCOSE, UA: NEGATIVE mg/dL
HGB URINE DIPSTICK: NEGATIVE
KETONES UR: NEGATIVE mg/dL
Nitrite: NEGATIVE
PROTEIN: NEGATIVE mg/dL
Specific Gravity, Urine: 1.036 — ABNORMAL HIGH (ref 1.005–1.030)
UROBILINOGEN UA: 1 mg/dL (ref 0.0–1.0)
pH: 6.5 (ref 5.0–8.0)

## 2015-02-15 MED ORDER — CEPHALEXIN 500 MG PO CAPS: 500.0000 mg | ORAL_CAPSULE | Freq: Two times a day (BID) | ORAL | Status: DC

## 2015-02-15 MED ORDER — CIPROFLOXACIN HCL 500 MG PO TABS: 500.0000 mg | ORAL_TABLET | Freq: Two times a day (BID) | ORAL | Status: DC

## 2015-02-15 NOTE — ED Notes (Signed)
Pt has attempted to urinate but unsuccessful. Encouraged to drink water to urinate.

## 2015-02-15 NOTE — ED Notes (Signed)
Pt is unable to urinate at this time. 

## 2015-02-15 NOTE — ED Notes (Signed)
Patient c/o recent UTI, completed her abx and thought she had a yeast infection, used monostat and now has burning with urination.

## 2015-02-15 NOTE — Discharge Instructions (Signed)

## 2015-02-15 NOTE — ED Provider Notes (Signed)
CSN: [id number]     Arrival date & time 02/15/15  0055 History   By signing my name below, I, Chelsea Reed, attest that this documentation has been prepared under the direction and in the presence of Chelsea Hacker, MD.  Electronically Signed: Forrestine Reed, ED Scribe. 02/15/2015. 1:24 AM.   Chief Complaint  Patient presents with  . Urinary Tract Infection   The history is provided by the patient. No language interpreter was used.    HPI Comments: Chelsea Reed is a 29 y.o. female with a PMHx of kidney infections who presents to the Emergency Department here for a possible urinary tract infection this evening. Pt reports constant, ongoing dysuria and urinary urgency. Ms. Chelsea Reed was recently diagnosed with a kidney infection on 10/1 and was sent home with a prescription for an antibiotic which she has completed. Pt also states she treated herself for a yeast infection with OTC Monistat 3 days ago after noting abnormal vaginal discharge and UTI symptoms. Vaginal discharge has now resolved, however, dysuria and urinary urgency are still ongoing. Pt denies any new sexual partners. No recent fever, chills, nausea, back pain, vomiting, chest pain, or shortness of breath.  PCP: Chelsea Hamman, MD    Past Medical History  Diagnosis Date  . Anemia   . Heavy periods   . Bilateral ovarian cysts     resolve without intervention  . Trichomonas   . Urinary tract infection   . Migraine   . Eczema    Past Surgical History  Procedure Laterality Date  . Induced abortion      TABs x 5  . Tooth extraction     Family History  Problem Relation Age of Onset  . Hypertension Other   . Diabetes Other    Social History  Substance Use Topics  . Smoking status: Never Smoker   . Smokeless tobacco: Never Used  . Alcohol Use: Yes     Comment: social drinker   OB History    Gravida Para Term Preterm AB TAB SAB Ectopic Multiple Living   6 1 1  0 5 5 0 0 0 1     Review of Systems   Constitutional: Negative for fever and chills.  Respiratory: Negative for cough and shortness of breath.   Cardiovascular: Negative for chest pain.  Gastrointestinal: Negative for nausea, vomiting and abdominal pain.  Genitourinary: Positive for dysuria and urgency. Negative for hematuria.  Musculoskeletal: Negative for back pain.  Skin: Negative for rash.  Neurological: Negative for headaches.  Psychiatric/Behavioral: Negative for confusion.  All other systems reviewed and are negative.     Allergies  Review of patient's allergies indicates no known allergies.  Home Medications   Prior to Admission medications   Medication Sig Start Date End Date Taking? Authorizing Provider  ibuprofen (ADVIL,MOTRIN) 600 MG tablet Take 1 tablet (600 mg total) by mouth once. 12/20/14  Yes Gwen Pounds, CNM  cephALEXin (KEFLEX) 500 MG capsule Take 1 capsule (500 mg total) by mouth 2 (two) times daily. 02/15/15   Chelsea Hacker, MD  doxycycline (VIBRAMYCIN) 100 MG capsule Take 1 capsule (100 mg total) by mouth 2 (two) times daily. Take with a full glass of water/milk Patient not taking: Reported on 01/23/2015 01/15/15   Antonietta Breach, PA-C  metroNIDAZOLE (FLAGYL) 500 MG tablet Take 1 tablet (500 mg total) by mouth 2 (two) times daily. Patient not taking: Reported on 01/15/2015 12/20/14   Gwen Pounds, CNM  ondansetron (ZOFRAN ODT) 4 MG  disintegrating tablet 4mg  ODT q4 hours prn nausea/vomit Patient not taking: Reported on 01/23/2015 12/23/14   Debby Freiberg, MD  phenazopyridine (PYRIDIUM) 200 MG tablet Take 1 tablet (200 mg total) by mouth 3 (three) times daily as needed for pain. Patient not taking: Reported on 02/15/2015 01/24/15   Larene Pickett, PA-C  traMADol (ULTRAM) 50 MG tablet Take 1 tablet (50 mg total) by mouth every 6 (six) hours as needed. Patient not taking: Reported on 01/23/2015 01/15/15   Antonietta Breach, PA-C   Triage Vitals: BP 102/56 mmHg  Pulse 67  Temp(Src) 98 F (36.7 C) (Oral)   Resp 18  SpO2 100%  LMP 01/30/2015 (Exact Date)   Physical Exam  Constitutional: She is oriented to person, place, and time. She appears well-developed and well-nourished. No distress.  HENT:  Head: Normocephalic and atraumatic.  Cardiovascular: Normal rate and regular rhythm.   Pulmonary/Chest: Effort normal. No respiratory distress.  Abdominal: Soft. There is no tenderness.  Neurological: She is alert and oriented to person, place, and time.  Skin: Skin is warm and dry.  Psychiatric: She has a normal mood and affect.  Nursing note and vitals reviewed.   ED Course  Procedures (including critical care time)  DIAGNOSTIC STUDIES: Oxygen Saturation is 99% on RA, Normal by my interpretation.    COORDINATION OF CARE: 1:23 AM-Discussed treatment plan with pt at bedside and pt agreed to plan.     Labs Review Labs Reviewed  URINE CULTURE  URINALYSIS, ROUTINE W REFLEX MICROSCOPIC (NOT AT [facility])    Imaging Review No results found. I have personally reviewed and evaluated these images and lab results as part of my medical decision-making.   EKG Interpretation None      MDM   Final diagnoses:  UTI (lower urinary tract infection)   Patient presents with dysuria and urgency. Otherwise nontoxic. Denies fever or flank pain. Reports that she improved after antibiotics during her last kidney infection that her symptoms have recurred. Denies any new sexual contacts. Has had multiple evaluations with multiple STD screens. Urinalysis with moderate leukocyte esterase, 21-50 white cells and few bacteria. Prior urine cultures have shown no specific growth. Given patient's symptoms, feels reasonable to treat. She improved on Keflex last time. We will send Reed with Keflex. Urine cultures pending. Follow-up with primary physician and urology given the recurrent UTIs.  After history, exam, and medical workup I feel the patient has been appropriately medically screened and is safe for discharge  home. Pertinent diagnoses were discussed with the patient. Patient was given return precautions.  I personally performed the services described in this documentation, which was scribed in my presence. The recorded information has been reviewed and is accurate.   Chelsea Hacker, MD 02/15/15 [phone]

## 2015-02-16 LAB — URINE CULTURE: CULTURE: NO GROWTH

## 2015-04-07 LAB — PROCEDURE REPORT - SCANNED: Pap: NEGATIVE

## 2015-06-10 ENCOUNTER — Emergency Department (HOSPITAL_COMMUNITY)
Admission: EM | Admit: 2015-06-10 | Discharge: 2015-06-10 | Disposition: A | Discharge status: Home / Self Care | Payer: 59 | Attending: Emergency Medicine | Admitting: Emergency Medicine

## 2015-06-10 ENCOUNTER — Encounter (HOSPITAL_COMMUNITY): Payer: Self-pay

## 2015-06-10 DIAGNOSIS — Z8619 Personal history of other infectious and parasitic diseases: Secondary | ICD-10-CM | POA: Insufficient documentation

## 2015-06-10 DIAGNOSIS — K0889 Other specified disorders of teeth and supporting structures: Secondary | ICD-10-CM | POA: Insufficient documentation

## 2015-06-10 DIAGNOSIS — Z8679 Personal history of other diseases of the circulatory system: Secondary | ICD-10-CM | POA: Diagnosis not present

## 2015-06-10 DIAGNOSIS — Z8742 Personal history of other diseases of the female genital tract: Secondary | ICD-10-CM | POA: Insufficient documentation

## 2015-06-10 DIAGNOSIS — Z862 Personal history of diseases of the blood and blood-forming organs and certain disorders involving the immune mechanism: Secondary | ICD-10-CM | POA: Insufficient documentation

## 2015-06-10 DIAGNOSIS — Z872 Personal history of diseases of the skin and subcutaneous tissue: Secondary | ICD-10-CM | POA: Insufficient documentation

## 2015-06-10 DIAGNOSIS — Z792 Long term (current) use of antibiotics: Secondary | ICD-10-CM | POA: Insufficient documentation

## 2015-06-10 DIAGNOSIS — Z87440 Personal history of urinary (tract) infections: Secondary | ICD-10-CM | POA: Insufficient documentation

## 2015-06-10 MED ORDER — NAPROXEN 500 MG PO TABS: 500.0000 mg | ORAL_TABLET | Freq: Two times a day (BID) | ORAL | Status: DC

## 2015-06-10 MED ORDER — PENICILLIN V POTASSIUM 500 MG PO TABS: 500.0000 mg | ORAL_TABLET | Freq: Four times a day (QID) | ORAL | Status: DC

## 2015-06-10 NOTE — ED Notes (Signed)
Pt with dental pain x 1 week.  Taking advil and numbing medicine.  Helps a little.  Appt with dentist for March 1

## 2015-06-10 NOTE — Discharge Instructions (Signed)
Dental Pain  ° ° °Dental pain may be caused by many things, including:  °Tooth decay (cavities or caries). Cavities expose the nerve of your tooth to air and hot or cold temperatures. This can cause pain or discomfort.  °Abscess or infection. A dental abscess is a collection of infected pus from a bacterial infection in the inner part of the tooth (pulp). It usually occurs at the end of the tooth's root.  °Injury.  °An unknown reason (idiopathic). °Your pain may be mild or severe. It may only occur when:  °You are chewing.  °You are exposed to hot or cold temperature.  °You are eating or drinking sugary foods or beverages, such as soda or candy. °Your pain may also be constant.  °HOME CARE INSTRUCTIONS  °Watch your dental pain for any changes. The following actions may help to lessen any discomfort that you are feeling:  °Take medicines only as directed by your dentist.  °If you were prescribed an antibiotic medicine, finish all of it even if you start to feel better.  °Keep all follow-up visits as directed by your dentist. This is important.  °Do not apply heat to the outside of your face.  °Rinse your mouth or gargle with salt water if directed by your dentist. This helps with pain and swelling.  °You can make salt water by adding ¼ tsp of salt to 1 cup of warm water. °Apply ice to the painful area of your face:  °Put ice in a plastic bag.  °Place a towel between your skin and the bag.  °Leave the ice on for 20 minutes, 2-3 times per day. °Avoid foods or drinks that cause you pain, such as:  °Very hot or very cold foods or drinks.  °Sweet or sugary foods or drinks. °SEEK MEDICAL CARE IF:  °Your pain is not controlled with medicines.  °Your symptoms are worse.  °You have new symptoms. °SEEK IMMEDIATE MEDICAL CARE IF:  °You are unable to open your mouth.  °You are having trouble breathing or swallowing.  °You have a fever.  °Your face, neck, or jaw is swollen. °This information is not intended to replace advice  given to you by your health care provider. Make sure you discuss any questions you have with your health care provider.  °Document Released: 04/10/2005 Document Revised: 08/25/2014 Document Reviewed: 04/06/2014  °Elsevier Interactive Patient Education ©2016 Elsevier Inc.  ° ° °Emergency Department Resource Guide °1) Find a Doctor and Pay Out of Pocket °Although you won't have to find out who is covered by your insurance plan, it is a good idea to ask around and get recommendations. You will then need to call the office and see if the doctor you have chosen will accept you as a new patient and what types of options they offer for patients who are self-pay. Some doctors offer discounts or will set up payment plans for their patients who do not have insurance, but you will need to ask so you aren't surprised when you get to your appointment. ° °2) Contact Your Local Health Department °Not all health departments have doctors that can see patients for sick visits, but many do, so it is worth a call to see if yours does. If you don't know where your local health department is, you can check in your phone book. The CDC also has a tool to help you locate your state's health department, and many state websites also have listings of all of their local health departments. ° °  3) Find a Walk-in Clinic °If your illness is not likely to be very severe or complicated, you may want to try a walk in clinic. These are popping up all over the country in pharmacies, drugstores, and shopping centers. They're usually staffed by nurse practitioners or physician assistants that have been trained to treat common illnesses and complaints. They're usually fairly quick and inexpensive. However, if you have serious medical issues or chronic medical problems, these are probably not your best option. ° °No Primary Care Doctor: °- Call [facility] at  832-8000 - they can help you locate a primary care doctor that  accepts your insurance,  provides certain services, etc. °- Physician Referral Service- 1-800-533-3463 ° °Chronic Pain Problems: °Organization         Address  Phone   Notes  °[facility]  (336) 297-2271 Patients need to be referred by their primary care doctor.  ° °Medication Assistance: °Organization         Address  Phone   Notes  °[place] [facility] [address] °[place], [place] [address] (336) 641-8030 --Must be a resident of [place] °-- Must have NO insurance coverage whatsoever (no Medicaid/ Medicare, etc.) °-- The pt. MUST have a primary care doctor that directs their care regularly and follows them in the community °  °[facility]  (866) 331-1348   °[employer]  (888) 892-1162   ° °Agencies that provide inexpensive medical care: °Organization         Address  Phone   Notes  °[facility]  (336) 832-8035   °[facility]    (336) 832-7272   °[facility] [address] °[place], [place] [address] (336) 832-4777   °[facility] [address], °[place] (336) 271-4999   °[facility]    (336) 373-0678   °[facility]    (336) 272-1050   °[facility] ° [address], [place] Phone:  (336) 832-4444, Fax:  (336) 832-4440 Hours of Operation:  9 am - 6 pm, M-F.  Also accepts Medicaid/Medicare and self-pay.  °[facility] ° [address], [place] Phone: (336) 832-3150, Fax: (336) 832-3151. Hours of Operation:  8:30 am - 5:30 pm, M-F.  Also accepts Medicaid and self-pay.  °[facility] [place] [address], [place] Phone: (336) 878-6027   °[facility] [address], [place], [place] (336)723-1848, Ext. 123 Mondays & Thursdays: 7-9 AM.  First 15 patients are seen on a first come, first serve basis. °  ° °Medicaid-accepting [place] Providers: ° °Organization         Address  Phone    Notes  °[facility] [address], [place] (336) 641-2100 Also accepts self-pay patients.  °[facility] [address], [place] ° (336) 856-9996   °[facility] [address], [place] (336) 288-8857   °[facility] [address] [place] [address], [place] (336) 299-7000   °Veita Bland [address], [place]  ° (336) 373-1557 Only accepts [place] Access Medicaid patients after they have their name applied to their card.  ° °Self-Pay (no insurance) in [place]: ° °Organization         Address  Phone   Notes  °Sickle Cell Patients, [facility] [address], [place] (336) 832-1970   °[facility] [address], [place] (336) 832-4400   °[facility] ° [address], [place] (336) 992-4800   °[facility]/Dr. Osei-Bonsu ° [address] [place] [address], [place] or [address], [place] (336) 841-8500 Phone number for both [place] and [place] locations is the same.  °[facility] [address], [place] (336) 299-0000   °[facility] [address] [place] [address], [place] or [address] (336) 852-7530 °(336) 878-2260   °[facility] [address], [place] (336) 350-1642, phone; (336) 294-5005, fax Sees patients 1st and 3rd Saturday of every month.  Must not qualify for public or private insurance (i.e. Medicaid, Medicare, [place] Health Choice, Veterans' Benefits) • Household income should be no more than 200% of the poverty level •The clinic cannot treat you if you are pregnant or think you are pregnant • Sexually transmitted diseases are not treated at the clinic.  ° ° °Dental Care: °Organization         Address  Phone  Notes  °[facility] [address], [place] (336)  641-6152 Accepts children up to age 21 who are enrolled in Medicaid or [place] Health Choice; pregnant women with a Medicaid card; and children who have applied for Medicaid or [place] Health Choice, but were declined, whose parents can pay a reduced fee at time of service.  °[facility] [place]  [address], [place] (336) 641-7733 Accepts children up to age 21 who are enrolled in Medicaid or [place] Health Choice; pregnant women with a Medicaid card; and children who have applied for Medicaid or [place] Health Choice, but were declined, whose parents can pay a reduced fee at time of service.  °[facility] ° [address], [place] (336) 641-4533 Patients are seen by appointment only. Walk-ins are not accepted. [facility] will see patients 18 years of age and older. °Monday - Tuesday (8am-5pm) °Most Wednesdays (8:30-5pm) °$30 per visit, cash only  °[facility] ° [address], [place] (336) 641-4533 Patients are seen by appointment only. Walk-ins are not accepted. [facility] will see patients 18 years of age and older. °One Wednesday Evening (Monthly: Volunteer Based).  $30 per visit, cash only  °[facility]  (919) 537-3737 for adults; Children under age 4, call Graduate Pediatric Dentistry at (919) [phone]. Children aged 4-14, please call (919) [phone] to request a pediatric application. ° Dental services are provided in all areas of dental care including fillings, crowns and bridges, complete and partial dentures, implants, gum treatment, root canals, and extractions. Preventive care is also provided. Treatment is provided to both adults and children. °Patients are selected via a lottery and there is often a waiting list. °  °[facility] [address], °[place] ° (336) 763-8833 [email or web address] °  °[facility] [address], [place], [place] (336)723-1848, Ext.  123 Second and Fourth Thursday of each month, opens at 6:30 AM; Clinic ends at 9 AM.  Patients are seen on a first-come first-served basis, and a limited number are seen during each clinic.  ° °[facility] ° [address], [place], [place] (336) 723-7904   Eligibility Requirements °You must have lived in [place],   [place], or [place] counties for at least the last three months. °  You cannot be eligible for state or federal sponsored healthcare insurance, including Veterans Administration, Medicaid, or Medicare. °  You generally cannot be eligible for healthcare insurance through your employer.  °  How to apply: °Eligibility screenings are held every Tuesday and Wednesday afternoon from 1:00 pm until 4:00 pm. You do not need an appointment for the interview!  °[facility] [address], [place], [place] 336-631-2330   °[facility]  336-342-8273   °[facility]  336-703-3100   °[facility]  336-570-6415   ° °Behavioral Health Resources in the Community: °Intensive Outpatient Programs °Organization         Address  Phone  Notes  °[facility] [address], [place], [place] 336-878-6098   °[facility] [address], [place], [place] 336-832-9800   °[facility] [address], [place], [place] ° 336-882-2125   °[facility] [address],  °[place], [place] 1-800-853-5163 or 336-641-4981   °Substance Abuse Resources °Organization         Address  Phone  Notes  °[facility]  336-882-2125   °[facility]  336-784-9470   °[facility]  336-285-9073   °[facility]  336-845-3988   °Residential & Outpatient Substance Abuse Program  1-800-659-3381   °Psychological Services °Organization         Address  Phone  Notes  °[facility]  336- 832-9600   °[facility]  336- 378-7881   °[place] [address], [place] 1-800-853-5163 or 336-641-4981   ° °Mobile Crisis Teams °Organization         Address  Phone  Notes  °[facility], Mobile Crisis Care Unit  1-877-626-1772   °Assertive °[facility] ° [address] [place], [place] 336-834-9664   °Sharon DeEsch [address] °[place] 336-554-5454   ° °Self-Help/Support Groups °Organization         Address  Phone             Notes  °[facility] [place] - variety of support groups  336- 373-1402 Call for more information  °Narcotics Anonymous (NA), [facility] [address], °[place]  2 meetings at this location  ° °Residential Treatment Programs °Organization         Address  Phone  Notes  °[facility] [address],    °[place]  1-866-801-8205   °[facility] ° [address], [place], [place] 704-293-8524   °[facility] [address], [place] 336-845-3988 Admissions: 8am-3pm M-F  °[facility] [address]    °[place], [place] 336-841-1104   °[facility] [address], [place], [place] 336-379-7146   °[facility] [address]  °[place], [place] 336-285-9073   °[facility] - Intensive Outpatient [address], [place], [place] 336-852-3033   °[facility] [address]  °[place], [place] 1-877-615-2722 or 336-784-9470   °[facility] [address] [place], [place] 336-227-7417 Accepts Medicaid  °[facility] [address]  ° [place] 1-800-659-3381 Substance Abuse/Addiction Treatment  ° °[place] Behavioral Health Resources °Organization         Address  Phone  Notes  °[facility]  (888) 581-9988   °Julie Brannon, PhD [address] [place], [place]   (336) 349-5553 or (336) 951-0000   °[facility]   [address] °[place], [place] (336) 349-4454   °[facility] [address], [place], [place] (336) 342-8316 Insurance/Medicaid/sponsorship through [facility]  °[facility] [address] [place] [address]                                    [place], [place] (336) 342-8316 Therapy/tele-psych/case  °[facility] [address]  ° [place], [place] (336) 349-2233    °Dr. Arfeen  (336) 349-4544   °[facility] [place]  [employer] [place] [facility] 1) [address], [place] °2) [address], [place] °3)  [address], [place] (336) 349-3220 °(336) 342-7768 ° °(336) 342-8140   °[place] Child Abuse Hotline (336) 342-1394 or (336) 342-3537 (After Hours)    ° ° ° °

## 2015-06-10 NOTE — ED Provider Notes (Signed)
CSN: [id number]     Arrival date & time 06/10/15  [id number] History   First MD Initiated Contact with Patient 06/10/15 [phone]     Chief Complaint  Patient presents with  . Dental Pain    Chelsea Reed is a 30 y.o. female who presents to the emergency department complaining of right lower dental pain ongoing for the past month which is worsened in the past week. She complains of 8 out of 10 sharp right lower dental pain currently. She reports she made an appointment for follow-up with a dentist on March 1. She reports taking ibuprofen with little relief today. She reports pain with chewing or cold liquids. Patient denies fevers, discharge from mouth, trouble opening her mouth, sore throat, trouble swallowing, ear pain, ear discharge, neck pain, neck stiffness, and just her vision or rashes.   The history is provided by the patient. No language interpreter was used.    Past Medical History  Diagnosis Date  . Anemia   . Heavy periods   . Bilateral ovarian cysts     resolve without intervention  . Trichomonas   . Urinary tract infection   . Migraine   . Eczema    Past Surgical History  Procedure Laterality Date  . Induced abortion      TABs x 5  . Tooth extraction     Family History  Problem Relation Age of Onset  . Hypertension Other   . Diabetes Other    Social History  Substance Use Topics  . Smoking status: Never Smoker   . Smokeless tobacco: Never Used  . Alcohol Use: Yes     Comment: social drinker   OB History    Gravida Para Term Preterm AB TAB SAB Ectopic Multiple Living   6 1 1  0 5 5 0 0 0 1     Review of Systems  Constitutional: Negative for fever and chills.  HENT: Positive for dental problem. Negative for congestion, drooling, ear discharge, ear pain, facial swelling, hearing loss, mouth sores, sore throat and trouble swallowing.   Eyes: Negative for visual disturbance.  Respiratory: Negative for cough, shortness of breath and wheezing.   Cardiovascular:  Negative for chest pain.  Gastrointestinal: Negative for nausea, vomiting, abdominal pain and diarrhea.  Skin: Negative for rash.  Neurological: Negative for light-headedness and headaches.      Allergies  Review of patient's allergies indicates no known allergies.  Home Medications   Prior to Admission medications   Medication Sig Start Date End Date Taking? Authorizing Provider  cephALEXin (KEFLEX) 500 MG capsule Take 1 capsule (500 mg total) by mouth 2 (two) times daily. 02/15/15   Merryl Hacker, MD  doxycycline (VIBRAMYCIN) 100 MG capsule Take 1 capsule (100 mg total) by mouth 2 (two) times daily. Take with a full glass of water/milk Patient not taking: Reported on 01/23/2015 01/15/15   Antonietta Breach, PA-C  ibuprofen (ADVIL,MOTRIN) 600 MG tablet Take 1 tablet (600 mg total) by mouth once. 12/20/14   Gwen Pounds, CNM  metroNIDAZOLE (FLAGYL) 500 MG tablet Take 1 tablet (500 mg total) by mouth 2 (two) times daily. Patient not taking: Reported on 01/15/2015 12/20/14   Gwen Pounds, CNM  naproxen (NAPROSYN) 500 MG tablet Take 1 tablet (500 mg total) by mouth 2 (two) times daily with a meal. 06/10/15   Waynetta Pean, PA-C  ondansetron (ZOFRAN ODT) 4 MG disintegrating tablet 4mg  ODT q4 hours prn nausea/vomit Patient not taking: Reported on 01/23/2015 12/23/14  Debby Freiberg, MD  penicillin v potassium (VEETID) 500 MG tablet Take 1 tablet (500 mg total) by mouth 4 (four) times daily. 06/10/15   Waynetta Pean, PA-C  phenazopyridine (PYRIDIUM) 200 MG tablet Take 1 tablet (200 mg total) by mouth 3 (three) times daily as needed for pain. Patient not taking: Reported on 02/15/2015 01/24/15   Larene Pickett, PA-C  traMADol (ULTRAM) 50 MG tablet Take 1 tablet (50 mg total) by mouth every 6 (six) hours as needed. Patient not taking: Reported on 01/23/2015 01/15/15   Antonietta Breach, PA-C   BP 114/80 mmHg  Pulse 71  Temp(Src) 98.6 F (37 C) (Oral)  Resp 18  SpO2 99%  LMP 05/17/2015 Physical Exam   Constitutional: She is oriented to person, place, and time. She appears well-developed and well-nourished. No distress.  Non-toxic appearing.   HENT:  Head: Normocephalic and atraumatic.  Right Ear: External ear normal.  Left Ear: External ear normal.  Mouth/Throat: Oropharynx is clear and moist. No oropharyngeal exudate.  Tenderness to right lower molar.  No discharge from the mouth. No facial swelling.  Uvula is midline without edema. Soft palate rises symmetrically. No tonsillar hypertrophy or exudates. Tongue protrusion is normal. No trismus.  Bilateral tympanic membranes are pearly-gray without erythema or loss of landmarks.   Eyes: Conjunctivae are normal. Pupils are equal, round, and reactive to light. Right eye exhibits no discharge. Left eye exhibits no discharge.  Neck: Normal range of motion. Neck supple. No JVD present. No tracheal deviation present.  Cardiovascular: Normal rate, regular rhythm, normal heart sounds and intact distal pulses.   Pulmonary/Chest: Effort normal and breath sounds normal. No respiratory distress.  Lymphadenopathy:    She has no cervical adenopathy.  Neurological: She is alert and oriented to person, place, and time. Coordination normal.  Skin: Skin is warm and dry. No rash noted. She is not diaphoretic. No erythema. No pallor.  Psychiatric: She has a normal mood and affect. Her behavior is normal.  Nursing note and vitals reviewed.   ED Course  Procedures (including critical care time) Labs Review Labs Reviewed - No data to display        Filed Vitals:   06/10/15 0729  BP: 114/80  Pulse: 71  Temp: 98.6 F (37 C)  TempSrc: Oral  Resp: 18  SpO2: 99%     MDM   Meds given in ED:  Medications - No data to display  Discharge Medication List as of 06/10/2015  7:41 AM    START taking these medications   Details  naproxen (NAPROSYN) 500 MG tablet Take 1 tablet (500 mg total) by mouth 2 (two) times daily with a meal., Starting  06/10/2015, Until Discontinued, Print    penicillin v potassium (VEETID) 500 MG tablet Take 1 tablet (500 mg total) by mouth 4 (four) times daily., Starting 06/10/2015, Until Discontinued, Print        Final diagnoses:  Pain, dental   This is a 30 y.o. female who presents to the emergency department complaining of right lower dental pain ongoing for the past month which is worsened in the past week. She complains of 8 out of 10 sharp right lower dental pain currently. She reports she made an appointment for follow-up with a dentist on March 1.  Patient with toothache.  No gross abscess.  Exam unconcerning for Ludwig's angina or spread of infection.  Will treat with penicillin and pain medicine.  Urged patient to follow-up with dentist.  Patient given resource guide.  I advised the patient to follow-up with their primary care provider this week. I advised the patient to return to the emergency department with new or worsening symptoms or new concerns. The patient verbalized understanding and agreement with plan.       Waynetta Pean, PA-C 06/10/15 [id number]  Davonna Belling, MD 06/10/15 [id number]

## 2015-11-15 ENCOUNTER — Ambulatory Visit (INDEPENDENT_AMBULATORY_CARE_PROVIDER_SITE_OTHER): Payer: Medicaid Other | Admitting: Obstetrics and Gynecology

## 2015-11-15 ENCOUNTER — Encounter: Payer: Self-pay | Admitting: Obstetrics and Gynecology

## 2015-11-15 VITALS — Ht 65.0 in | Wt 177.0 lb

## 2015-11-15 DIAGNOSIS — Z8619 Personal history of other infectious and parasitic diseases: Secondary | ICD-10-CM | POA: Insufficient documentation

## 2015-11-15 DIAGNOSIS — A499 Bacterial infection, unspecified: Secondary | ICD-10-CM | POA: Diagnosis not present

## 2015-11-15 DIAGNOSIS — N898 Other specified noninflammatory disorders of vagina: Secondary | ICD-10-CM | POA: Insufficient documentation

## 2015-11-15 DIAGNOSIS — B9689 Other specified bacterial agents as the cause of diseases classified elsewhere: Secondary | ICD-10-CM

## 2015-11-15 DIAGNOSIS — N760 Acute vaginitis: Secondary | ICD-10-CM

## 2015-11-15 MED ORDER — METRONIDAZOLE 500 MG PO TABS: 500.0000 mg | ORAL_TABLET | Freq: Two times a day (BID) | ORAL | 0 refills | Status: DC

## 2015-11-15 NOTE — Progress Notes (Addendum)
Obstetrics and Gynecology Visit Problem Visit Evaluation  Appointment Date: 11/15/2015  OBGYN Clinic: [place]  Primary Care Provider: Gracy Racer A  Referring Provider: self  Chief Complaint: continued vaginal discharge, smell  History of Present Illness: Chelsea Reed is a 30 y.o. African-American [phone] (Patient's last menstrual period was 10/31/2015.), seen for the above chief complaint. Patient diagnosed with first STI of trichomonas back in April/may of this year.She received treatment with flagyl but still is having some greenish discharge and having a smell; she states she was told by her partner that he was treated.   No fevers, chills, abdominal pain, dysuria, diarrhea, constipation  Pap this year normal per patient.   Review of Systems:Her 12 point review of systems is negative or as noted in the History of Present Illness.  Patient Active Problem List   Diagnosis Date Noted  . History of trichomoniasis 11/15/2015  . Vaginal discharge 11/15/2015    Past Medical History:  Past Medical History:  Diagnosis Date  . Anemia   . Bilateral ovarian cysts    resolve without intervention  . Eczema   . Heavy periods   . Migraine   . Trichomonas   . Urinary tract infection     Past Surgical History:  Past Surgical History:  Procedure Laterality Date  . INDUCED ABORTION     TABs x 5  . TOOTH EXTRACTION      Past Obstetrical History:  OB History    Gravida Para Term Preterm AB Living   6 1 1  0 5 1   SAB TAB Ectopic Multiple Live Births   0 5 0 0        Past Gynecological History: As per HPI.  Social History:  Social History   Social History  . Marital status: Single    Spouse name: N/A  . Number of children: N/A  . Years of education: N/A   Occupational History  . Not on file.   Social History Main Topics  . Smoking status: Never Smoker  . Smokeless tobacco: Never Used  . Alcohol use Yes     Comment: social drinker  . Drug use: No  .  Sexual activity: Yes    Birth control/ protection: None   Other Topics Concern  . Not on file   Social History Narrative  . No narrative on file    Family History:  Family History  Problem Relation Age of Onset  . Hypertension Other   . Diabetes Other    Medications none  Allergies Review of patient's allergies indicates no known allergies.   Physical Exam:  Ht 5\' 5"  (1.651 m)   Wt 177 lb (80.3 kg)   LMP 10/31/2015   BMI 29.45 kg/m  Body mass index is 29.45 kg/m. General appearance: Well nourished, well developed female in no acute distress.  Respiratory:   Normal respiratory effort Abdomen: positive bowel sounds and no masses, hernias; diffusely non tender to palpation, non distended Back: no CVAT Neuro/Psych:  Normal mood and affect.  Skin:  Warm and dry.  Lymphatic:  No inguinal lymphadenopathy.   Pelvic exam: is not limited by body habitus EGBUS: within normal limits Vagina: within normal limits and with no blood in the vault but with some mild white vaginal discharge, non malodorous, Cervix:  no lesions or cervical motion tenderness Uterus:  nonenlarged and approximately 8 week sized Adnexa:  normal adnexa and no mass, fullness, tenderness Rectovaginal: deferred  Laboratory: wet prep with >20% clue cells, no lactobacilli, +WBCs  Radiology: none  Assessment: BV, trich TOC  Plan: f/u test of cure. Flagyl bid x 7d given to patient.  She does douche after each cycle and also uses a feminine wash and was advised to avoid those and to just do dove soap and/or sitz baths if she feels like she has to clean in the GU area.  Orders Placed This Encounter  Procedures  . Chlamydia/Gonococcus/Trichomonas, NAA    RTC PRN  Durene Romans MD Attending Center for [facility])

## 2015-11-16 LAB — CHLAMYDIA/GONOCOCCUS/TRICHOMONAS, NAA
Chlamydia by NAA: NEGATIVE
GONOCOCCUS BY NAA: NEGATIVE
TRICH VAG BY NAA: NEGATIVE

## 2015-12-06 ENCOUNTER — Emergency Department (HOSPITAL_COMMUNITY)
Admission: EM | Admit: 2015-12-06 | Discharge: 2015-12-06 | Disposition: A | Discharge status: Home / Self Care | Payer: 59 | Attending: Emergency Medicine | Admitting: Emergency Medicine

## 2015-12-06 ENCOUNTER — Encounter (HOSPITAL_COMMUNITY): Payer: Self-pay

## 2015-12-06 ENCOUNTER — Emergency Department (HOSPITAL_COMMUNITY): Payer: 59

## 2015-12-06 DIAGNOSIS — M545 Low back pain, unspecified: Secondary | ICD-10-CM

## 2015-12-06 DIAGNOSIS — R102 Pelvic and perineal pain: Secondary | ICD-10-CM | POA: Insufficient documentation

## 2015-12-06 DIAGNOSIS — Z791 Long term (current) use of non-steroidal anti-inflammatories (NSAID): Secondary | ICD-10-CM | POA: Insufficient documentation

## 2015-12-06 LAB — POC URINE PREG, ED: Preg Test, Ur: NEGATIVE

## 2015-12-06 MED ORDER — ACETAMINOPHEN 500 MG PO TABS
1000.0000 mg | ORAL_TABLET | Freq: Once | ORAL | Status: AC
  Administered 2015-12-06: 1000 mg via ORAL
  Filled 2015-12-06: qty 2

## 2015-12-06 MED ORDER — IBUPROFEN 800 MG PO TABS
800.0000 mg | ORAL_TABLET | Freq: Once | ORAL | Status: AC
  Administered 2015-12-06: 800 mg via ORAL
  Filled 2015-12-06: qty 1

## 2015-12-06 NOTE — Discharge Instructions (Signed)
Take 4 over the counter ibuprofen tablets 3 times a day or 2 over-the-counter naproxen tablets twice a day for pain. Also take tylenol 1000mg(2 extra strength) four times a day.    

## 2015-12-06 NOTE — ED Provider Notes (Signed)
[facility] DEPT Provider Note   CSN: [id number] Arrival date & time: 12/06/15  [id number]     History   Chief Complaint Chief Complaint  Patient presents with  . Fall  . Back Pain  . Pelvic Pain    HPI Chelsea Reed is a 30 y.o. female.  30 yo F with a chief complaint of left-sided low back pain. This started a couple days ago after she fell. She slipped in the shower and landed with her back outside of the shower. Didn't have any real issue immediately after the fall. Over the course of the past couple days the pain is gotten worse. Feels like she has some sharp shooting pain into her left buttock. Denies loss of bowel or bladder. Denies difficulty with ambulation though does have some worsening pain with it.   The history is provided by the patient.  Fall  This is a new problem. The current episode started less than 1 hour ago. The problem occurs constantly. The problem has not changed since onset.Pertinent negatives include no chest pain, no headaches and no shortness of breath. The symptoms are aggravated by walking, bending, twisting and standing. Nothing relieves the symptoms. She has tried nothing for the symptoms. The treatment provided no relief.  Back Pain   Associated symptoms include pelvic pain. Pertinent negatives include no chest pain, no fever, no headaches and no dysuria.  Pelvic Pain  Pertinent negatives include no chest pain, no headaches and no shortness of breath.    Past Medical History:  Diagnosis Date  . Anemia   . Bilateral ovarian cysts    resolve without intervention  . Eczema   . Heavy periods   . Migraine   . Trichomonas   . Urinary tract infection     Patient Active Problem List   Diagnosis Date Noted  . History of trichomoniasis 11/15/2015  . Vaginal discharge 11/15/2015    Past Surgical History:  Procedure Laterality Date  . INDUCED ABORTION     TABs x 5  . TOOTH EXTRACTION      OB History    Gravida Para Term Preterm AB Living    6 1 1  0 5 1   SAB TAB Ectopic Multiple Live Births   0 5 0 0         Home Medications    Prior to Admission medications   Medication Sig Start Date End Date Taking? Authorizing Provider  cephALEXin (KEFLEX) 500 MG capsule Take 1 capsule (500 mg total) by mouth 2 (two) times daily. Patient not taking: Reported on 11/15/2015 02/15/15   Merryl Hacker, MD  doxycycline (VIBRAMYCIN) 100 MG capsule Take 1 capsule (100 mg total) by mouth 2 (two) times daily. Take with a full glass of water/milk Patient not taking: Reported on 01/23/2015 01/15/15   Antonietta Breach, PA-C  ibuprofen (ADVIL,MOTRIN) 600 MG tablet Take 1 tablet (600 mg total) by mouth once. Patient not taking: Reported on 11/15/2015 12/20/14   Gwen Pounds, CNM  metroNIDAZOLE (FLAGYL) 500 MG tablet Take 1 tablet (500 mg total) by mouth 2 (two) times daily. 11/15/15   Aletha Halim, MD  naproxen (NAPROSYN) 500 MG tablet Take 1 tablet (500 mg total) by mouth 2 (two) times daily with a meal. Patient not taking: Reported on 11/15/2015 06/10/15   Waynetta Pean, PA-C  ondansetron (ZOFRAN ODT) 4 MG disintegrating tablet 4mg  ODT q4 hours prn nausea/vomit Patient not taking: Reported on 01/23/2015 12/23/14   Debby Freiberg, MD  penicillin v  potassium (VEETID) 500 MG tablet Take 1 tablet (500 mg total) by mouth 4 (four) times daily. Patient not taking: Reported on 11/15/2015 06/10/15   Waynetta Pean, PA-C  phenazopyridine (PYRIDIUM) 200 MG tablet Take 1 tablet (200 mg total) by mouth 3 (three) times daily as needed for pain. Patient not taking: Reported on 02/15/2015 01/24/15   Larene Pickett, PA-C  traMADol (ULTRAM) 50 MG tablet Take 1 tablet (50 mg total) by mouth every 6 (six) hours as needed. Patient not taking: Reported on 01/23/2015 01/15/15   Antonietta Breach, PA-C    Family History Family History  Problem Relation Age of Onset  . Hypertension Other   . Diabetes Other     Social History Social History  Substance Use Topics  . Smoking  status: Never Smoker  . Smokeless tobacco: Never Used  . Alcohol use Yes     Comment: social drinker     Allergies   Review of patient's allergies indicates no known allergies.   Review of Systems Review of Systems  Constitutional: Negative for chills and fever.  HENT: Negative for congestion and rhinorrhea.   Eyes: Negative for redness and visual disturbance.  Respiratory: Negative for shortness of breath and wheezing.   Cardiovascular: Negative for chest pain and palpitations.  Gastrointestinal: Negative for nausea and vomiting.  Genitourinary: Positive for pelvic pain. Negative for dysuria and urgency.  Musculoskeletal: Positive for arthralgias, back pain, gait problem and myalgias.  Skin: Negative for pallor and wound.  Neurological: Negative for dizziness and headaches.     Physical Exam Updated Vital Signs BP 116/69 (BP Location: Left Arm)   Pulse 65   Temp 98.6 F (37 C) (Oral)   Resp 16   Ht 5\' 5"  (1.651 m)   Wt 177 lb (80.3 kg)   LMP 11/23/2015   SpO2 100%   BMI 29.45 kg/m   Physical Exam  Constitutional: She is oriented to person, place, and time. She appears well-developed and well-nourished. No distress.  HENT:  Head: Normocephalic and atraumatic.  Eyes: EOM are normal. Pupils are equal, round, and reactive to light.  Neck: Normal range of motion. Neck supple.  Cardiovascular: Normal rate and regular rhythm.  Exam reveals no gallop and no friction rub.   No murmur heard. Pulmonary/Chest: Effort normal. She has no wheezes. She has no rales.  Abdominal: Soft. She exhibits no distension. There is no tenderness.  Musculoskeletal: She exhibits tenderness. She exhibits no edema.  Mild left SI joint tenderness. No piriformis tenderness. Negative straight leg raise test bilaterally. Pulse motor and sensation is intact distally.  Neurological: She is alert and oriented to person, place, and time.  Skin: Skin is warm and dry. She is not diaphoretic.    Psychiatric: She has a normal mood and affect. Her behavior is normal.  Nursing note and vitals reviewed.    ED Treatments / Results  Labs (all labs ordered are listed, but only abnormal results are displayed) Labs Reviewed  POC URINE PREG, ED    EKG  EKG Interpretation None       Radiology Dg Lumbar Spine Complete  Result Date: 12/06/2015 CLINICAL DATA:  Low back, buttocks and left hip pain since falling 3 days ago. Initial encounter. EXAM: LUMBAR SPINE - COMPLETE 4+ VIEW COMPARISON:  None. FINDINGS: Five lumbar type vertebral bodies. The alignment is normal aside from a minimal convex left scoliosis. There is possible minimal disc space narrowing at L5-S1. The additional disc spaces are preserved. No evidence of acute  fracture or pars defect. IMPRESSION: No acute osseous findings. Possible minimal disc space narrowing at L5-S1. Electronically Signed   By: Richardean Sale M.D.   On: 12/06/2015 10:29    Procedures Procedures (including critical care time)  Medications Ordered in ED Medications  acetaminophen (TYLENOL) tablet 1,000 mg (1,000 mg Oral Given 12/06/15 1041)  ibuprofen (ADVIL,MOTRIN) tablet 800 mg (800 mg Oral Given 12/06/15 1041)     Initial Impression / Assessment and Plan / ED Course  I have reviewed the triage vital signs and the nursing notes.  Pertinent labs & imaging results that were available during my care of the patient were reviewed by me and considered in my medical decision making (see chart for details).  Clinical Course    30 yo F With a chief complaint of left sided low back pain. This happened after a fall couple days ago. Will obtain a plain film. Likely muscular in nature.  Xray negative, d/c home.   11:25 AM:  I have discussed the diagnosis/risks/treatment options with the patient and believe the pt to be eligible for discharge home to follow-up with PCP. We also discussed returning to the ED immediately if new or worsening sx occur. We  discussed the sx which are most concerning (e.g., cauda equina sx, fever) that necessitate immediate return. Medications administered to the patient during their visit and any new prescriptions provided to the patient are listed below.  Medications given during this visit Medications  acetaminophen (TYLENOL) tablet 1,000 mg (1,000 mg Oral Given 12/06/15 1041)  ibuprofen (ADVIL,MOTRIN) tablet 800 mg (800 mg Oral Given 12/06/15 1041)     The patient appears reasonably screen and/or stabilized for discharge and I doubt any other medical condition or other [facility] requiring further screening, evaluation, or treatment in the ED at this time prior to discharge.      Final Clinical Impressions(s) / ED Diagnoses   Final diagnoses:  Acute left-sided low back pain without sciatica    New Prescriptions Discharge Medication List as of 12/06/2015 10:33 AM       Deno Etienne, DO 12/06/15 1126

## 2015-12-06 NOTE — ED Triage Notes (Signed)
Pt c/o increasing low back and pelvic pain after a slip and fall x 2 days ago.  Pain score 8/10.  Pt has not taken anything for pain.  Pt reports that she slipped, fell out of her shower, and landed on her buttocks.  Sts pain is "mainly on left side."  Sts difficulty "standing straight up."

## 2016-07-27 ENCOUNTER — Emergency Department (HOSPITAL_COMMUNITY): Admission: EM | Admit: 2016-07-27 | Discharge: 2016-07-28 | Discharge status: Against Medical Advice | Payer: 59

## 2016-07-27 NOTE — ED Notes (Signed)
Called patient and patient did not answer.

## 2016-07-27 NOTE — ED Notes (Signed)
Called patient and no answer.

## 2016-07-28 NOTE — ED Notes (Signed)
Patient called and no answer. 

## 2016-08-28 IMAGING — CR DG CHEST 2V
2 series · 2 of 2 positions shown · non-contrast
Comparison: 10/21/2014

CLINICAL DATA: Bilateral chest pain since last evening

EXAM:
CHEST  2 VIEW

[w chest pa]
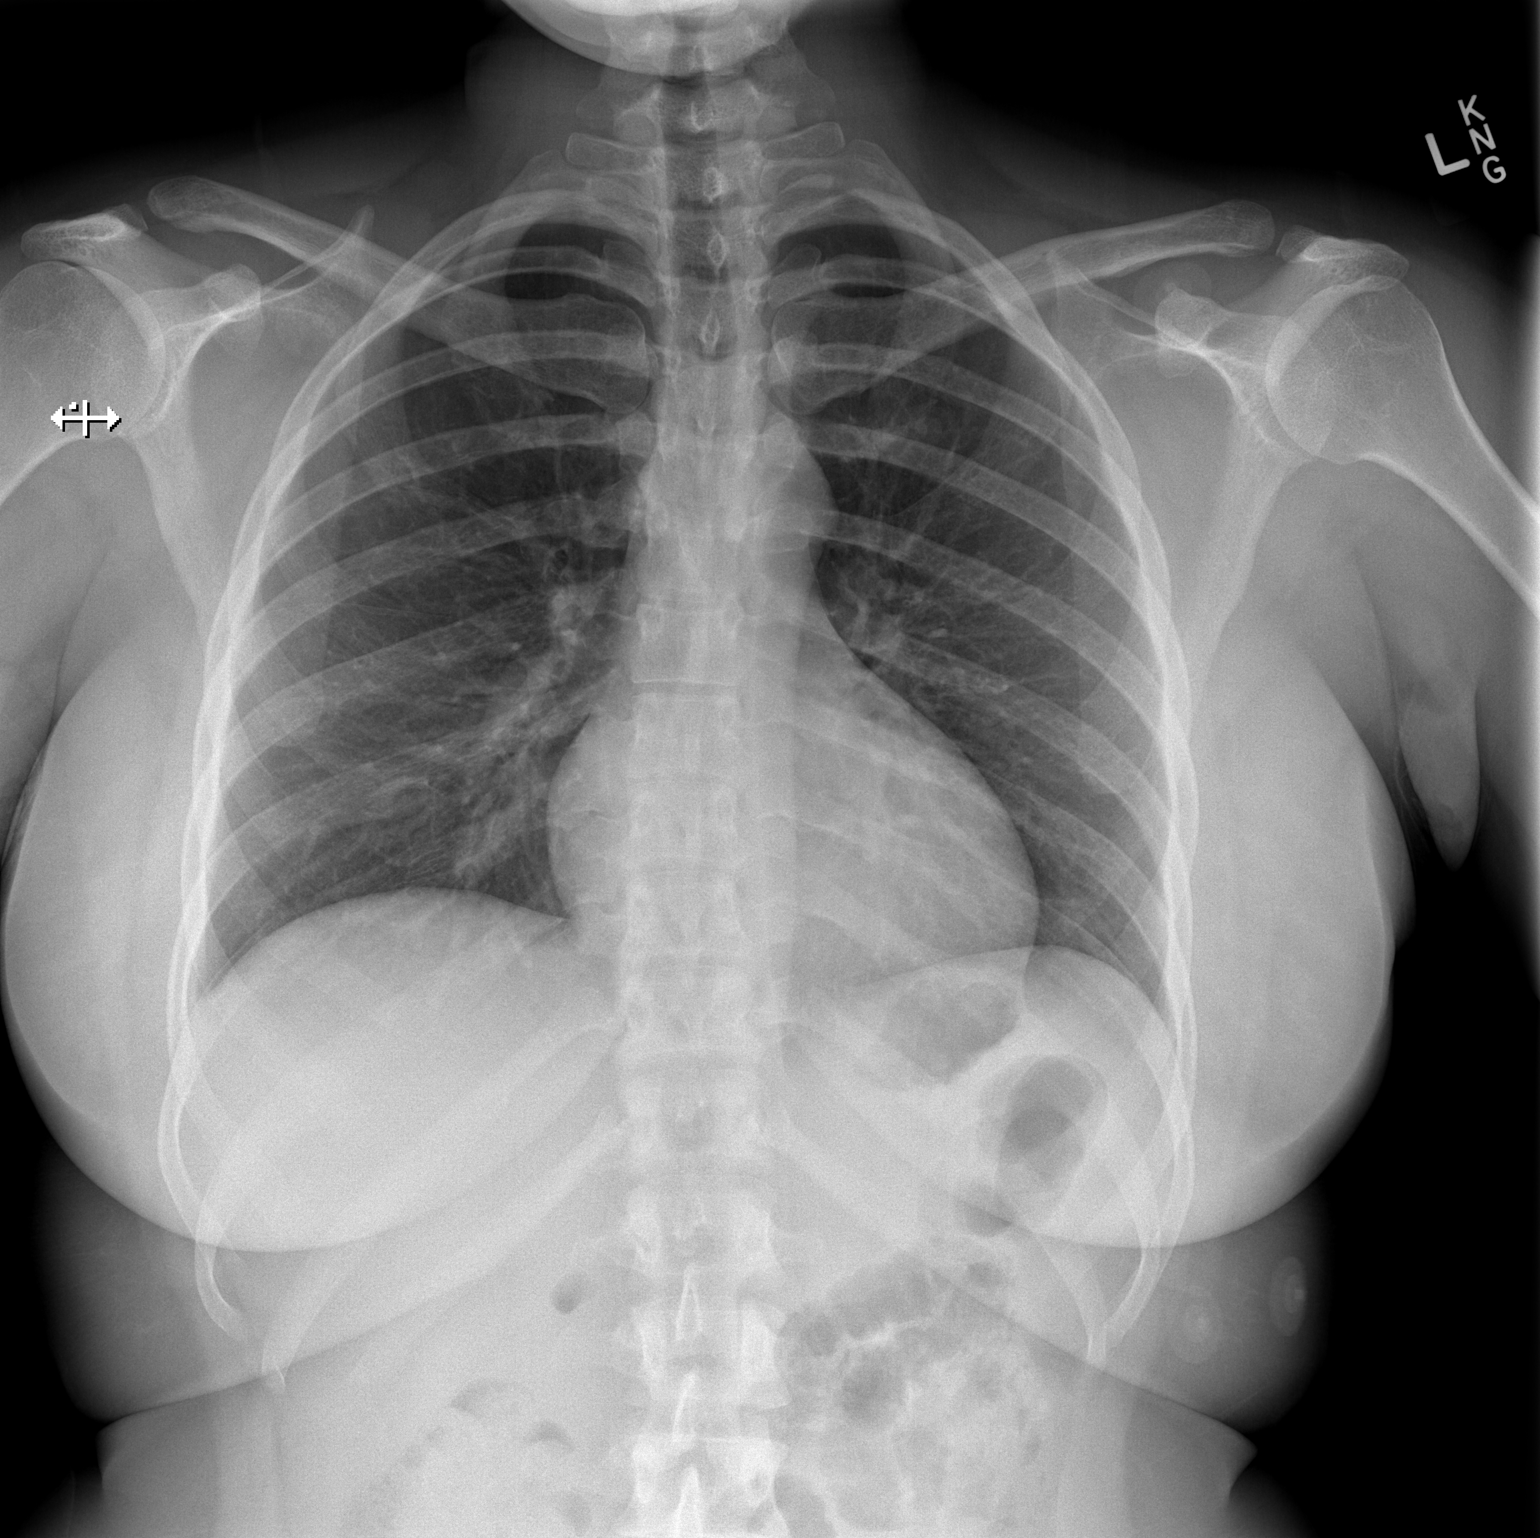

[w chest lat]
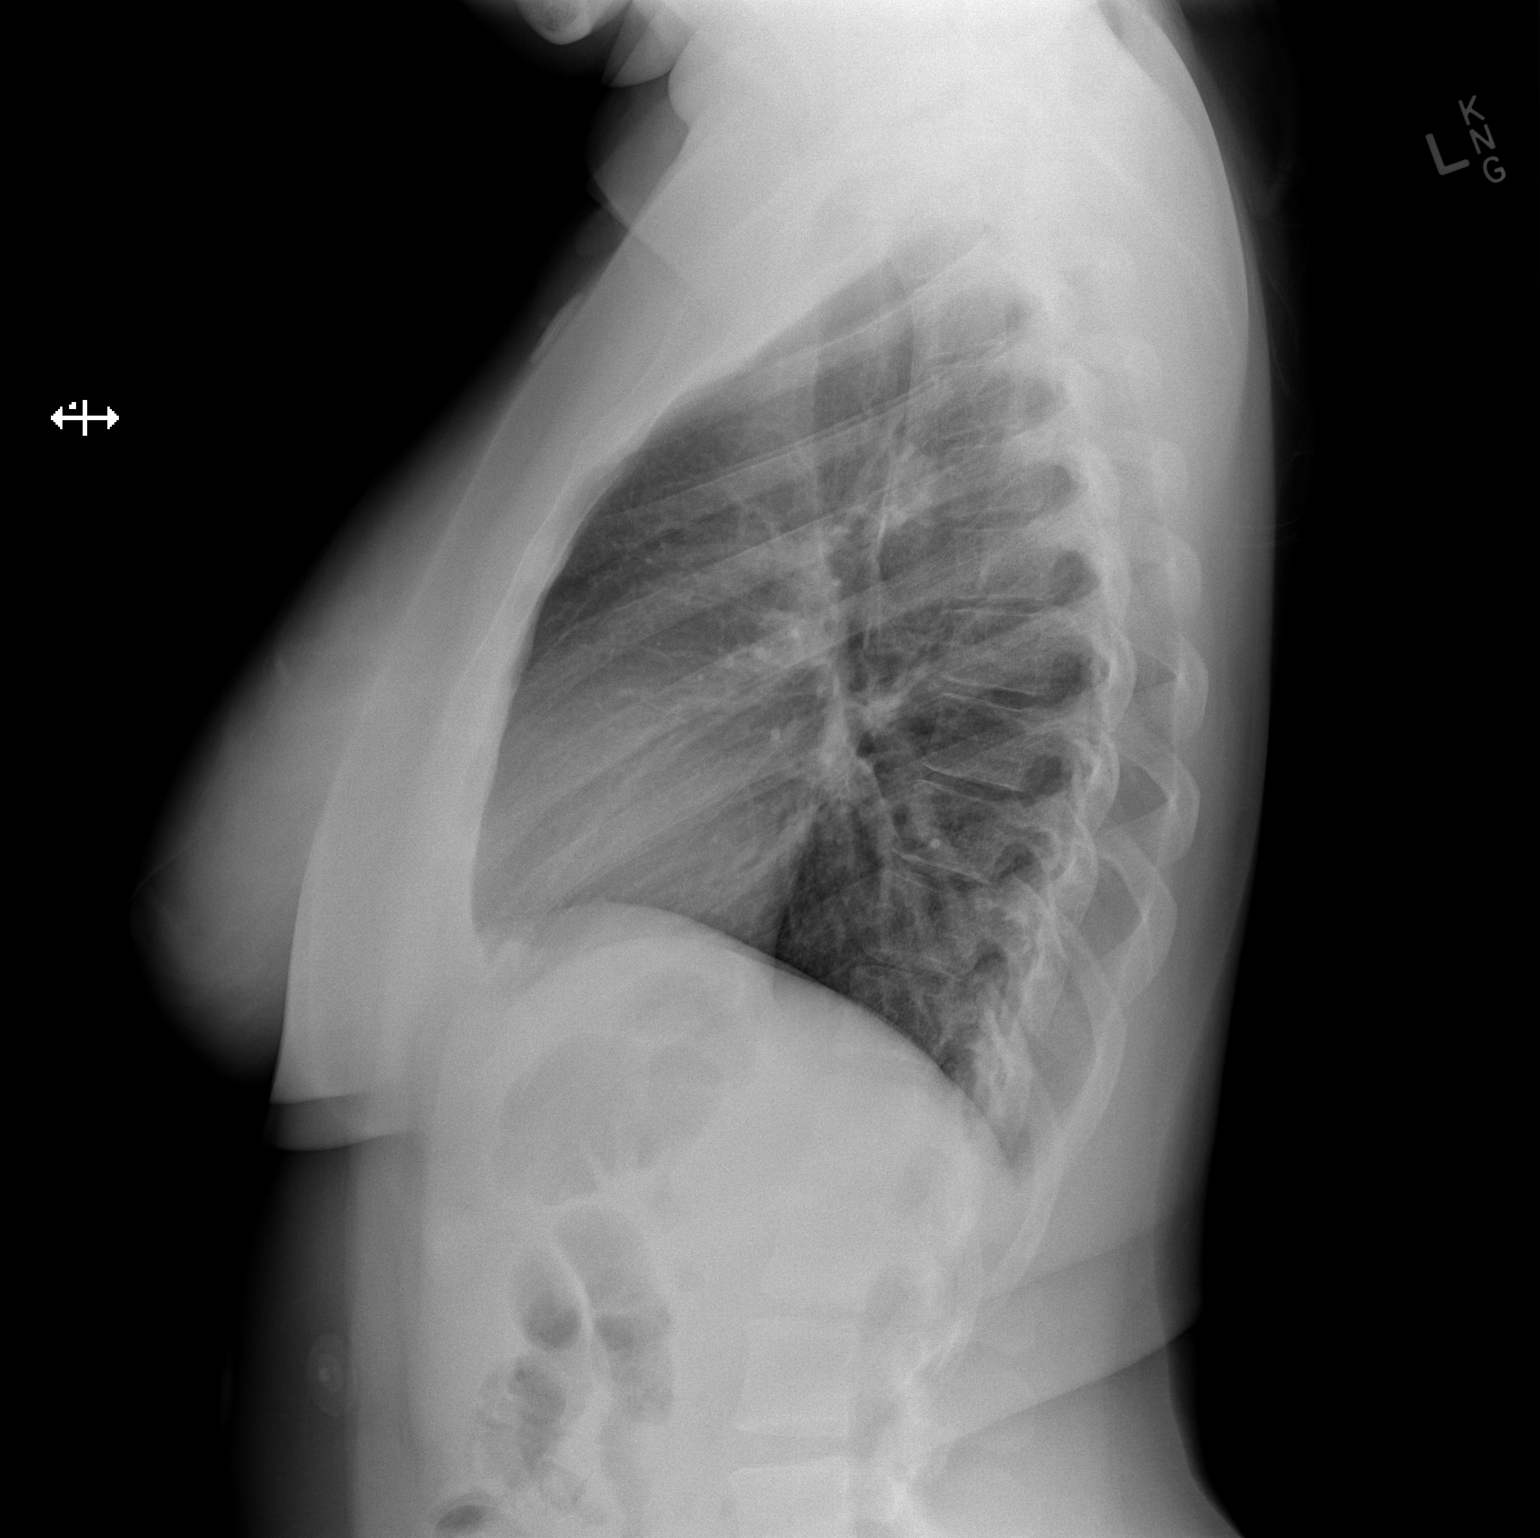

[2 of 2 positions shown; findings below may reference images not displayed]

FINDINGS: The cardiac silhouette, mediastinal and hilar contours are within
normal limits and stable. The lungs are clear. No pleural effusion.
The bony thorax is intact.
IMPRESSION: Normal chest x-ray.

## 2017-08-11 IMAGING — CR DG LUMBAR SPINE COMPLETE 4+V
5 series · 5 of 5 positions shown · non-contrast
Comparison: None.

CLINICAL DATA: Low back, buttocks and left hip pain since falling 3
days ago. Initial encounter.

EXAM:
LUMBAR SPINE - COMPLETE 4+ VIEW

[t lumbar spine ap]
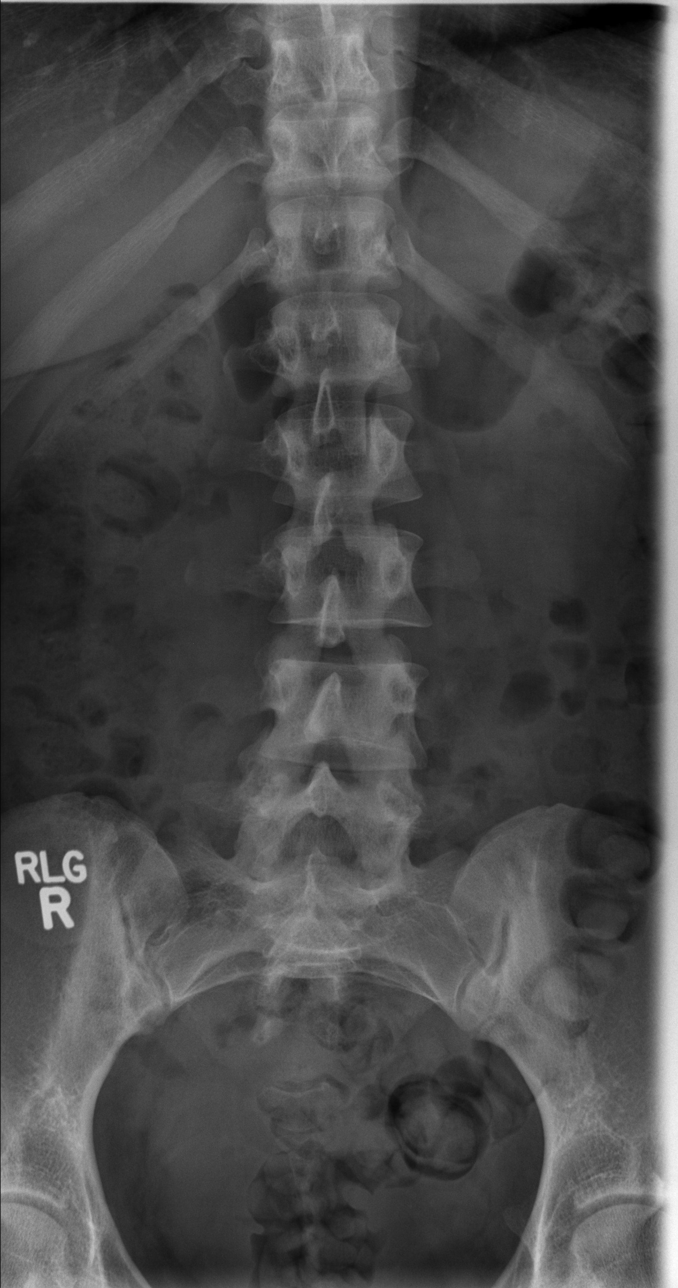

[t lumbar spine obl (1 of 2)]
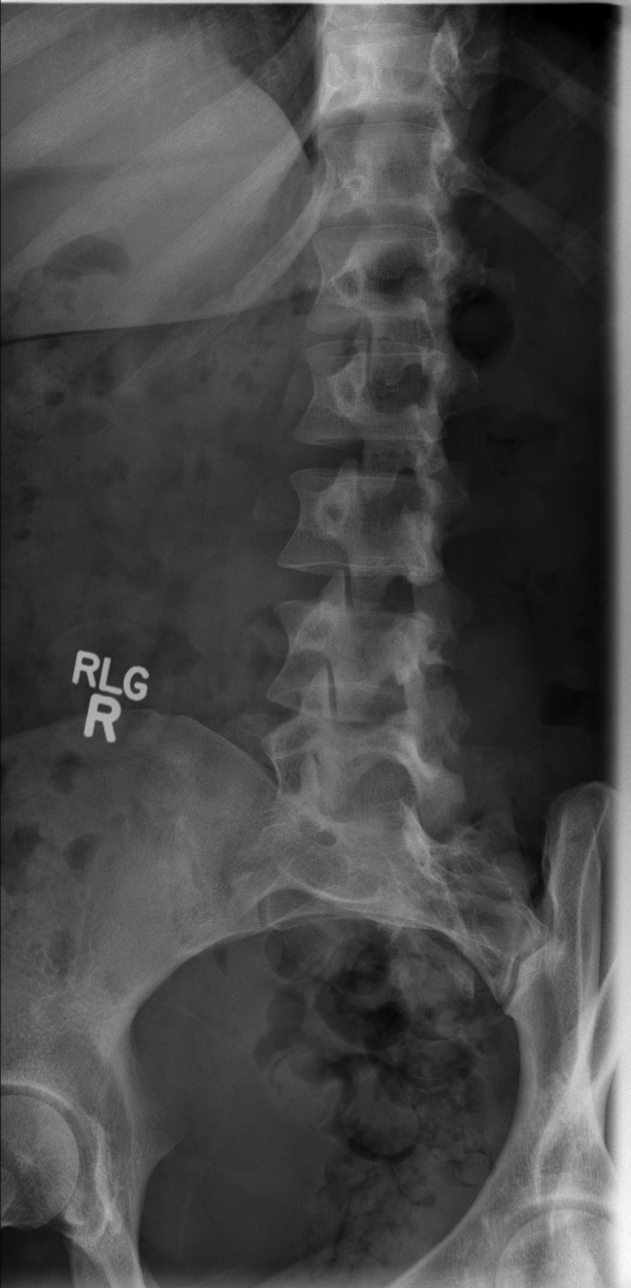

[t lumbar spine obl (2 of 2)]
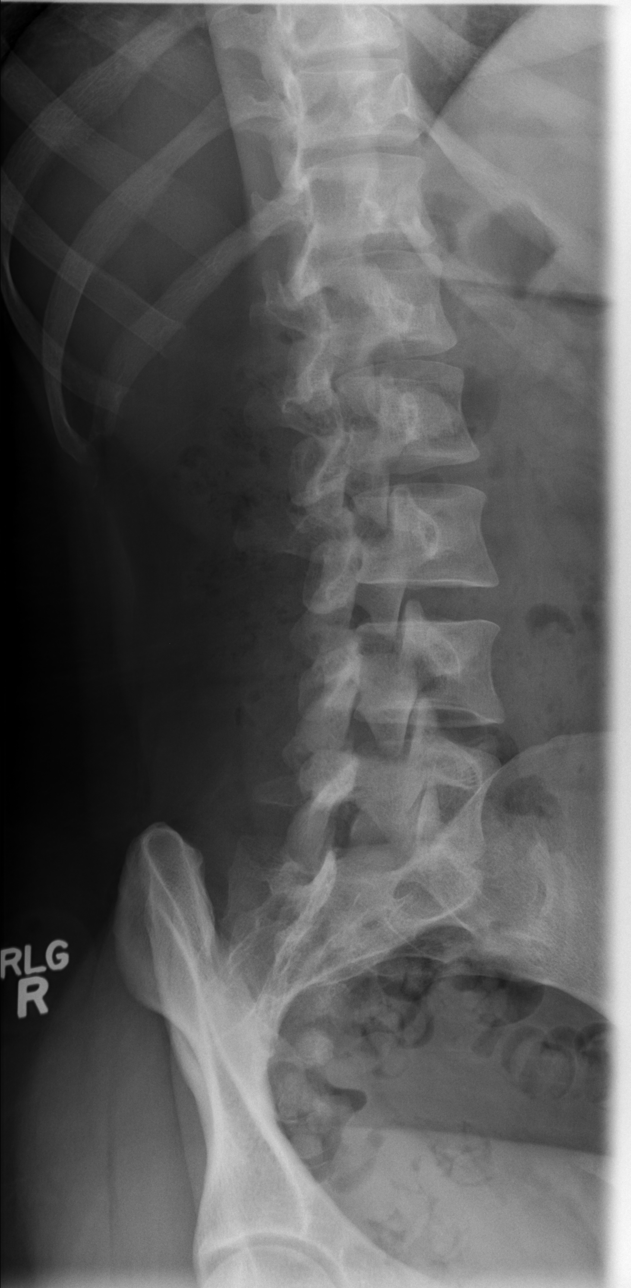

[t lumbar spine lat]
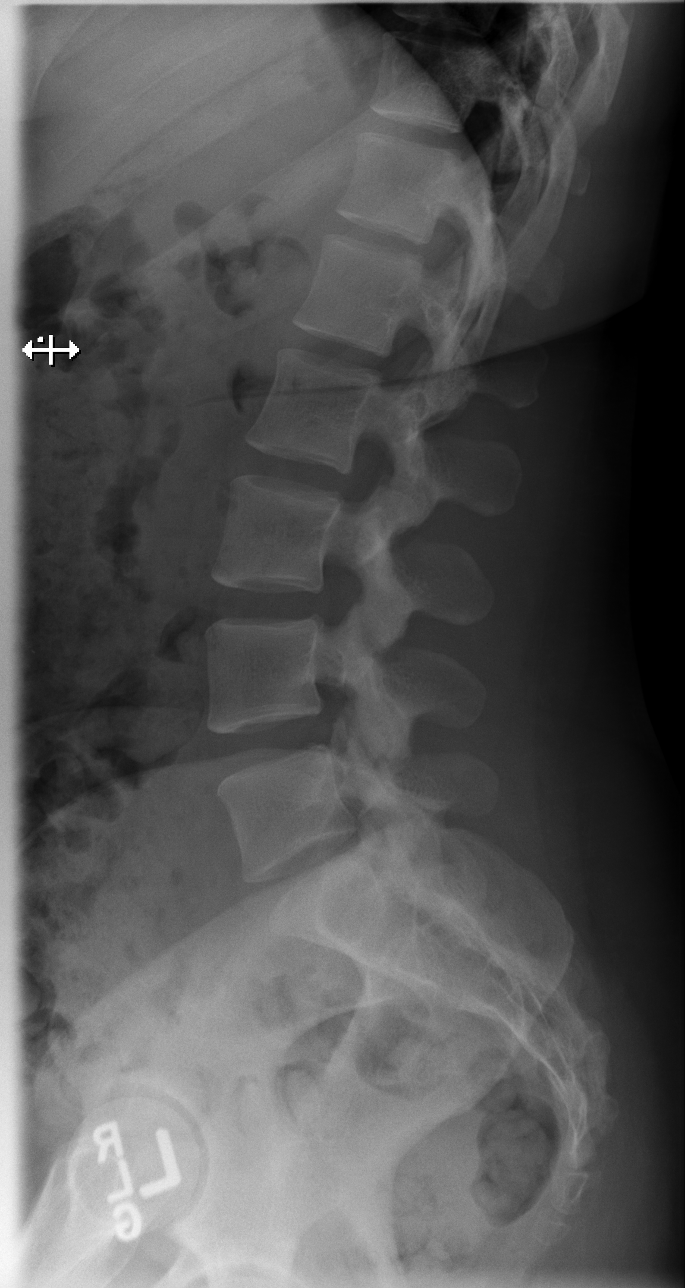

[t lumbar l-5 s-1 spot]
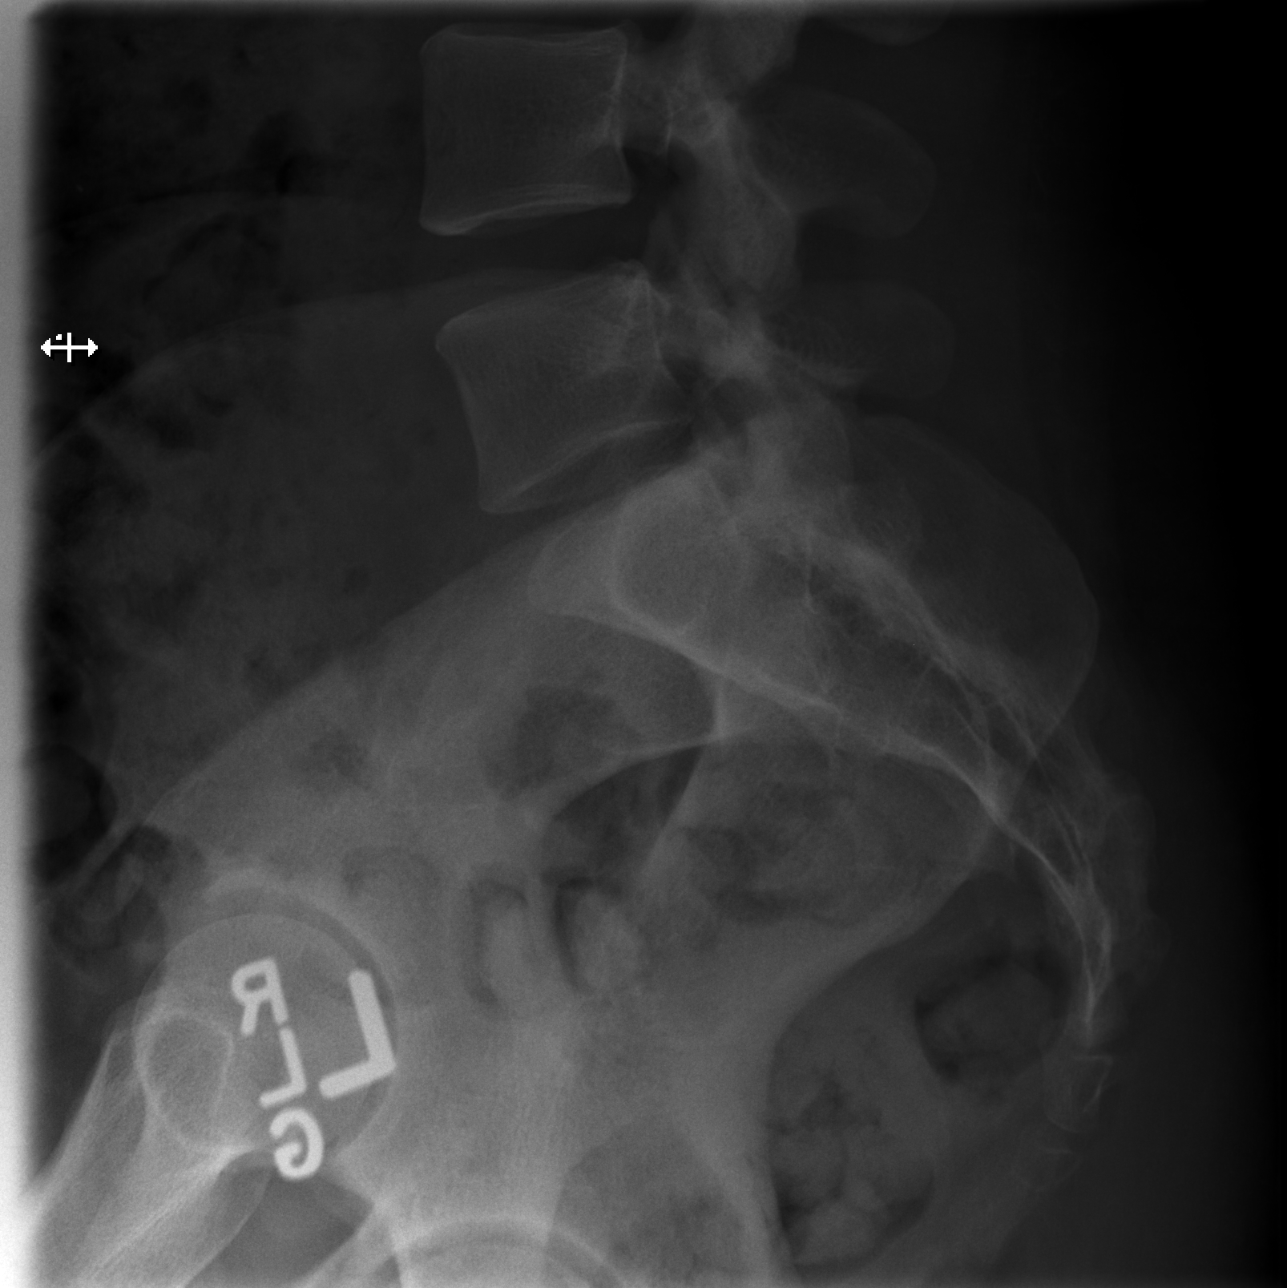

[5 of 5 positions shown; findings below may reference images not displayed]

FINDINGS: Five lumbar type vertebral bodies. The alignment is normal aside
from a minimal convex left scoliosis. There is possible minimal disc
space narrowing at L5-S1. The additional disc spaces are preserved.
No evidence of acute fracture or pars defect.
IMPRESSION: No acute osseous findings. Possible minimal disc space narrowing at
L5-S1.

## 2017-10-21 ENCOUNTER — Encounter (HOSPITAL_COMMUNITY): Payer: Self-pay | Admitting: Emergency Medicine

## 2017-10-21 ENCOUNTER — Other Ambulatory Visit: Payer: Self-pay

## 2017-10-21 ENCOUNTER — Emergency Department (HOSPITAL_COMMUNITY): Payer: Medicaid Other

## 2017-10-21 ENCOUNTER — Emergency Department (HOSPITAL_COMMUNITY)
Admission: EM | Admit: 2017-10-21 | Discharge: 2017-10-22 | Disposition: A | Discharge status: Home / Self Care | Payer: Medicaid Other | Attending: Emergency Medicine | Admitting: Emergency Medicine

## 2017-10-21 DIAGNOSIS — R05 Cough: Secondary | ICD-10-CM | POA: Diagnosis present

## 2017-10-21 DIAGNOSIS — B9789 Other viral agents as the cause of diseases classified elsewhere: Secondary | ICD-10-CM

## 2017-10-21 DIAGNOSIS — Z79899 Other long term (current) drug therapy: Secondary | ICD-10-CM | POA: Insufficient documentation

## 2017-10-21 DIAGNOSIS — J069 Acute upper respiratory infection, unspecified: Secondary | ICD-10-CM | POA: Diagnosis not present

## 2017-10-21 NOTE — ED Triage Notes (Signed)
Pt reports having a cough for the last several days along with sore throat when coughing and pain in back when coughing. Pt reports initial symptoms started 1 week ago.

## 2017-10-22 MED ORDER — NAPROXEN 500 MG PO TABS: 500.0000 mg | ORAL_TABLET | Freq: Two times a day (BID) | ORAL | 0 refills | Status: DC

## 2017-10-22 MED ORDER — BENZONATATE 100 MG PO CAPS: 100.0000 mg | ORAL_CAPSULE | Freq: Three times a day (TID) | ORAL | 0 refills | Status: DC

## 2017-10-22 NOTE — ED Provider Notes (Signed)
[place] DEPT Provider Note   CSN: [id number] Arrival date & time: 10/21/17  2057     History   Chief Complaint Chief Complaint  Patient presents with  . Cough    HPI LORRENA GORANSON is a 32 y.o. female.  HPI  32 year old female comes in a chief complaint of cough and left-sided chest discomfort.  Patient has been having cough for the last several days.  The cough is mostly raspy and dry, however she does produce off white, thick phlegm occasionally.  When patient coughs now she starts noticing some chest discomfort and today she had some sharp pain on the left side of her chest and back while she was folding clothes.  She is currently symptom-free, however when she moves her symptoms return.  There is no associated shortness of breath and patient denies any exertional component to the pain.  Review of system is negative for wheezing, fevers.  Past Medical History:  Diagnosis Date  . Anemia   . Bilateral ovarian cysts    resolve without intervention  . Eczema   . Heavy periods   . Migraine   . Trichomonas   . Urinary tract infection     Patient Active Problem List   Diagnosis Date Noted  . History of trichomoniasis 11/15/2015  . Vaginal discharge 11/15/2015    Past Surgical History:  Procedure Laterality Date  . INDUCED ABORTION     TABs x 5  . TOOTH EXTRACTION       OB History    Gravida  6   Para  1   Term  1   Preterm  0   AB  5   Living  1     SAB  0   TAB  5   Ectopic  0   Multiple  0   Live Births               Home Medications    Prior to Admission medications   Medication Sig Start Date End Date Taking? Authorizing Provider  benzonatate (TESSALON) 100 MG capsule Take 1 capsule (100 mg total) by mouth every 8 (eight) hours. 10/22/17   Varney Biles, MD  cephALEXin (KEFLEX) 500 MG capsule Take 1 capsule (500 mg total) by mouth 2 (two) times daily. Patient not taking: Reported on 11/15/2015  02/15/15   Horton, Barbette Hair, MD  doxycycline (VIBRAMYCIN) 100 MG capsule Take 1 capsule (100 mg total) by mouth 2 (two) times daily. Take with a full glass of water/milk Patient not taking: Reported on 01/23/2015 01/15/15   Antonietta Breach, PA-C  ibuprofen (ADVIL,MOTRIN) 600 MG tablet Take 1 tablet (600 mg total) by mouth once. Patient not taking: Reported on 11/15/2015 12/20/14   Gwen Pounds, CNM  metroNIDAZOLE (FLAGYL) 500 MG tablet Take 1 tablet (500 mg total) by mouth 2 (two) times daily. 11/15/15   Aletha Halim, MD  naproxen (NAPROSYN) 500 MG tablet Take 1 tablet (500 mg total) by mouth 2 (two) times daily with a meal. 10/22/17   Varney Biles, MD  ondansetron (ZOFRAN ODT) 4 MG disintegrating tablet 4mg  ODT q4 hours prn nausea/vomit Patient not taking: Reported on 01/23/2015 12/23/14   Debby Freiberg, MD  penicillin v potassium (VEETID) 500 MG tablet Take 1 tablet (500 mg total) by mouth 4 (four) times daily. Patient not taking: Reported on 11/15/2015 06/10/15   Waynetta Pean, PA-C  phenazopyridine (PYRIDIUM) 200 MG tablet Take 1 tablet (200 mg total) by mouth 3 (three)  times daily as needed for pain. Patient not taking: Reported on 02/15/2015 01/24/15   Larene Pickett, PA-C  traMADol (ULTRAM) 50 MG tablet Take 1 tablet (50 mg total) by mouth every 6 (six) hours as needed. Patient not taking: Reported on 01/23/2015 01/15/15   Antonietta Breach, PA-C    Family History Family History  Problem Relation Age of Onset  . Hypertension Other   . Diabetes Other     Social History Social History   Tobacco Use  . Smoking status: Never Smoker  . Smokeless tobacco: Never Used  Substance Use Topics  . Alcohol use: Yes    Comment: social drinker  . Drug use: No     Allergies   Patient has no known allergies.   Review of Systems Review of Systems  Constitutional: Negative for activity change and fever.  Respiratory: Positive for cough. Negative for shortness of breath.   Cardiovascular:  Positive for chest pain.  Gastrointestinal: Negative for nausea and vomiting.  Skin: Negative for rash.     Physical Exam Updated Vital Signs BP 128/88 (BP Location: Right Arm)   Pulse 70   Temp 98.2 F (36.8 C) (Oral)   Resp 18   Ht 5\' 5"  (1.651 m)   Wt 81.6 kg (180 lb)   LMP 10/17/2017   SpO2 99%   BMI 29.95 kg/m   Physical Exam  Constitutional: She is oriented to person, place, and time. She appears well-developed.  HENT:  Head: Normocephalic and atraumatic.  Eyes: EOM are normal.  Neck: Normal range of motion. Neck supple.  Cardiovascular: Normal rate.  Pulmonary/Chest: Effort normal.  Abdominal: Bowel sounds are normal.  Neurological: She is alert and oriented to person, place, and time.  Skin: Skin is warm and dry.  Nursing note and vitals reviewed.    ED Treatments / Results  Labs (all labs ordered are listed, but only abnormal results are displayed) Labs Reviewed - No data to display  EKG None  Radiology Dg Chest 2 View  Result Date: 10/21/2017 CLINICAL DATA:  Left-sided chest pain. EXAM: CHEST - 2 VIEW COMPARISON:  Chest x-ray dated December 23, 2014. FINDINGS: The heart size and mediastinal contours are within normal limits. Both lungs are clear. The visualized skeletal structures are unremarkable. IMPRESSION: Normal chest x-ray. Electronically Signed   By: Titus Dubin M.D.   On: 10/21/2017 23:45    Procedures Procedures (including critical care time)  Medications Ordered in ED Medications - No data to display   Initial Impression / Assessment and Plan / ED Course  I have reviewed the triage vital signs and the nursing notes.  Pertinent labs & imaging results that were available during my care of the patient were reviewed by me and considered in my medical decision making (see chart for details).     32 year old female comes in with chief complaint of cough and some chest discomfort.. Her lung exam is clear, patient does not have any  systemic symptoms that are concerning for underlying pneumonia, and without any shortness of breath, pleuritic chest pain we do not think she has PE.  Patient is PERC negative as well.  Chest x-ray does not show any acute findings.  My suspicion is that patient is having viral syndrome with a cough and chest wall pain.  We will treat her conservatively.  Final Clinical Impressions(s) / ED Diagnoses   Final diagnoses:  Viral URI with cough    ED Discharge Orders  Ordered    benzonatate (TESSALON) 100 MG capsule  Every 8 hours     10/22/17 0027    naproxen (NAPROSYN) 500 MG tablet  2 times daily with meals     10/22/17 0027       Varney Biles, MD 10/22/17 0037

## 2017-10-22 NOTE — Discharge Instructions (Addendum)
We saw you in the ER for the cough and pain that you are having. X-ray does not show any evidence of pneumonia. We think that you likely are having a viral infection, and the symptoms will be better in few days. Please take the medications prescribed for symptom control.

## 2017-11-03 ENCOUNTER — Emergency Department (HOSPITAL_COMMUNITY)
Admission: EM | Admit: 2017-11-03 | Discharge: 2017-11-03 | Disposition: A | Discharge status: Home / Self Care | Payer: Medicaid Other | Attending: Emergency Medicine | Admitting: Emergency Medicine

## 2017-11-03 ENCOUNTER — Encounter (HOSPITAL_COMMUNITY): Payer: Self-pay | Admitting: Nurse Practitioner

## 2017-11-03 DIAGNOSIS — R1084 Generalized abdominal pain: Secondary | ICD-10-CM | POA: Diagnosis present

## 2017-11-03 DIAGNOSIS — Z79899 Other long term (current) drug therapy: Secondary | ICD-10-CM | POA: Insufficient documentation

## 2017-11-03 DIAGNOSIS — R197 Diarrhea, unspecified: Secondary | ICD-10-CM | POA: Diagnosis not present

## 2017-11-03 LAB — URINALYSIS, ROUTINE W REFLEX MICROSCOPIC
BACTERIA UA: NONE SEEN
Bilirubin Urine: NEGATIVE
Glucose, UA: NEGATIVE mg/dL
Hgb urine dipstick: NEGATIVE
Ketones, ur: NEGATIVE mg/dL
Nitrite: NEGATIVE
PROTEIN: NEGATIVE mg/dL
Specific Gravity, Urine: 1.016 (ref 1.005–1.030)
pH: 7 (ref 5.0–8.0)

## 2017-11-03 LAB — PREGNANCY, URINE: PREG TEST UR: NEGATIVE

## 2017-11-03 LAB — C DIFFICILE QUICK SCREEN W PCR REFLEX
C DIFFICILE (CDIFF) INTERP: NOT DETECTED
C DIFFICILE (CDIFF) TOXIN: NEGATIVE
C Diff antigen: NEGATIVE

## 2017-11-03 MED ORDER — LOPERAMIDE HCL 2 MG PO CAPS: 2.0000 mg | ORAL_CAPSULE | Freq: Four times a day (QID) | ORAL | 0 refills | Status: DC | PRN

## 2017-11-03 NOTE — ED Triage Notes (Signed)
Pt is c/o diffuse abdominal pain that she describes as cramping with associated symptoms of diarrhea, reports traces of blood and mucous, and weakness. Denies any GI hx, reports onset of a couple hours ago.

## 2017-11-03 NOTE — ED Provider Notes (Signed)
[facility] DEPT Provider Note   CSN: [id number] Arrival date & time: 11/03/17  1550     History   Chief Complaint Chief Complaint  Patient presents with  . Abdominal Pain  . Diarrhea    HPI Chelsea Reed is a 32 y.o. female.  HPI Patient reports that she started having diarrhea today.  At first she just got some gurgling and cramping, and a diarrheal stool.  She thought it was likely something she ate.  Then she had to go to the restroom a couple more times.  Then she started to note that her bowel movement was bloody.  She had red blood and some mucousy looking stool.  She became concerned after this happened a few times.  She did not develop any vomiting.  No fever.  She denies significant amount of pain.  Just slight cramping prior to bowel movement.  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  She reports none of the baby seemed to have diarrhea.  She cannot think of anything that she ate that would be likely to cause a symptom.  No recent antibiotic treatments.  She was seen for URI couple weeks ago and has been treated symptomatically.  She reports that has improved. Past Medical History:  Diagnosis Date  . Anemia   . Bilateral ovarian cysts    resolve without intervention  . Eczema   . Heavy periods   . Migraine   . Trichomonas   . Urinary tract infection     Patient Active Problem List   Diagnosis Date Noted  . History of trichomoniasis 11/15/2015  . Vaginal discharge 11/15/2015    Past Surgical History:  Procedure Laterality Date  . INDUCED ABORTION     TABs x 5  . TOOTH EXTRACTION       OB History    Gravida  6   Para  1   Term  1   Preterm  0   AB  5   Living  1     SAB  0   TAB  5   Ectopic  0   Multiple  0   Live Births               Home Medications    Prior to Admission medications   Medication Sig Start  Date End Date Taking? Authorizing Provider  benzonatate (TESSALON) 100 MG capsule Take 1 capsule (100 mg total) by mouth every 8 (eight) hours. 10/22/17   Varney Biles, MD  cephALEXin (KEFLEX) 500 MG capsule Take 1 capsule (500 mg total) by mouth 2 (two) times daily. Patient not taking: Reported on 11/15/2015 02/15/15   Horton, Barbette Hair, MD  doxycycline (VIBRAMYCIN) 100 MG capsule Take 1 capsule (100 mg total) by mouth 2 (two) times daily. Take with a full glass of water/milk Patient not taking: Reported on 01/23/2015 01/15/15   Antonietta Breach, PA-C  ibuprofen (ADVIL,MOTRIN) 600 MG tablet Take 1 tablet (600 mg total) by mouth once. Patient not taking: Reported on 11/15/2015 12/20/14   Gwen Pounds, CNM  loperamide (IMODIUM) 2 MG capsule Take 1 capsule (2 mg total) by mouth 4 (four) times daily as needed for diarrhea or loose stools. 11/03/17   Charlesetta Shanks, MD  metroNIDAZOLE (FLAGYL) 500 MG tablet Take 1 tablet (500 mg total) by mouth 2 (two) times daily. 11/15/15   Pickens,  Eduard Clos, MD  naproxen (NAPROSYN) 500 MG tablet Take 1 tablet (500 mg total) by mouth 2 (two) times daily with a meal. 10/22/17   Varney Biles, MD  ondansetron (ZOFRAN ODT) 4 MG disintegrating tablet 4mg  ODT q4 hours prn nausea/vomit Patient not taking: Reported on 01/23/2015 12/23/14   Debby Freiberg, MD  penicillin v potassium (VEETID) 500 MG tablet Take 1 tablet (500 mg total) by mouth 4 (four) times daily. Patient not taking: Reported on 11/15/2015 06/10/15   Waynetta Pean, PA-C  phenazopyridine (PYRIDIUM) 200 MG tablet Take 1 tablet (200 mg total) by mouth 3 (three) times daily as needed for pain. Patient not taking: Reported on 02/15/2015 01/24/15   Larene Pickett, PA-C  traMADol (ULTRAM) 50 MG tablet Take 1 tablet (50 mg total) by mouth every 6 (six) hours as needed. Patient not taking: Reported on 01/23/2015 01/15/15   Antonietta Breach, PA-C    Family History Family History  Problem Relation Age of Onset  . Hypertension  Other   . Diabetes Other     Social History Social History   Tobacco Use  . Smoking status: Never Smoker  . Smokeless tobacco: Never Used  Substance Use Topics  . Alcohol use: Yes    Comment: social drinker  . Drug use: No     Allergies   Patient has no known allergies.   Review of Systems Review of Systems  10 Systems reviewed and are negative for acute change except as noted in the HPI.  Physical Exam Updated Vital Signs BP 140/89 (BP Location: Left Arm)   Pulse 74   Temp 98.7 F (37.1 C) (Oral)   Resp 18   LMP 10/17/2017   SpO2 99%   Physical Exam  Constitutional: She is oriented to person, place, and time. She appears well-developed and well-nourished.  HENT:  Head: Normocephalic and atraumatic.  Eyes: Pupils are equal, round, and reactive to light. EOM are normal.  Neck: Neck supple.  Cardiovascular: Normal rate, regular rhythm, normal heart sounds and intact distal pulses.  Pulmonary/Chest: Effort normal and breath sounds normal.  Abdominal: Soft. Bowel sounds are normal. She exhibits no distension. There is no tenderness.  Genitourinary:  Genitourinary Comments: Perianal area normal without fissures or bloody drainage.  Digital rectal exam trace amount of mucousy stool without visual appearance of blood.  Musculoskeletal: Normal range of motion. She exhibits no edema.  Neurological: She is alert and oriented to person, place, and time. She has normal strength. Coordination normal. GCS eye subscore is 4. GCS verbal subscore is 5. GCS motor subscore is 6.  Skin: Skin is warm, dry and intact.  Psychiatric: She has a normal mood and affect.     ED Treatments / Results  Labs (all labs ordered are listed, but only abnormal results are displayed) Labs Reviewed  URINALYSIS, ROUTINE W REFLEX MICROSCOPIC - Abnormal; Notable for the following components:      Result Value   Leukocytes, UA TRACE (*)    All other components within normal limits    GASTROINTESTINAL PANEL BY PCR, STOOL (REPLACES STOOL CULTURE)  C DIFFICILE QUICK SCREEN W PCR REFLEX  PREGNANCY, URINE    EKG None  Radiology No results found.  Procedures Procedures (including critical care time)  Medications Ordered in ED Medications - No data to display   Initial Impression / Assessment and Plan / ED Course  I have reviewed the triage vital signs and the nursing notes.  Pertinent labs & imaging results that were available during  my care of the patient were reviewed by me and considered in my medical decision making (see chart for details).     Final Clinical Impressions(s) / ED Diagnoses   Final diagnoses:  Diarrhea of presumed infectious origin  Patient is clinically well and has abruptly developed bloody diarrhea.  No fever no significant abdominal pain.  No history suggestive of inflammatory bowel disease.  At this time, I suspect infectious etiology.  Patient is well-hydrated.  She is counseled to continue home hydration.  She is to return if she develops fever severe abdominal pain, weakness or other concerning symptoms.  Stool sample has been obtained.  No apparent risk factors for C. difficile.  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  Patient will follow-up with PCP at the beginning of the week.  Return precautions reviewed.  ED Discharge Orders        Ordered    loperamide (IMODIUM) 2 MG capsule  4 times daily PRN     11/03/17 1729       Charlesetta Shanks, MD 11/03/17 1735

## 2017-11-04 LAB — GASTROINTESTINAL PANEL BY PCR, STOOL (REPLACES STOOL CULTURE)
Adenovirus F40/41: NOT DETECTED
Astrovirus: NOT DETECTED
CAMPYLOBACTER SPECIES: NOT DETECTED
CRYPTOSPORIDIUM: NOT DETECTED
CYCLOSPORA CAYETANENSIS: NOT DETECTED
ENTEROPATHOGENIC E COLI (EPEC): NOT DETECTED
ENTEROTOXIGENIC E COLI (ETEC): NOT DETECTED
Entamoeba histolytica: NOT DETECTED
Enteroaggregative E coli (EAEC): NOT DETECTED
Giardia lamblia: NOT DETECTED
Norovirus GI/GII: NOT DETECTED
PLESIMONAS SHIGELLOIDES: NOT DETECTED
Rotavirus A: NOT DETECTED
SAPOVIRUS (I, II, IV, AND V): NOT DETECTED
SHIGA LIKE TOXIN PRODUCING E COLI (STEC): NOT DETECTED
Salmonella species: NOT DETECTED
Shigella/Enteroinvasive E coli (EIEC): NOT DETECTED
VIBRIO SPECIES: NOT DETECTED
Vibrio cholerae: NOT DETECTED
YERSINIA ENTEROCOLITICA: NOT DETECTED

## 2018-01-12 ENCOUNTER — Encounter (HOSPITAL_COMMUNITY): Payer: Self-pay

## 2018-01-12 ENCOUNTER — Emergency Department (HOSPITAL_COMMUNITY)
Admission: EM | Admit: 2018-01-12 | Discharge: 2018-01-12 | Disposition: A | Discharge status: Home / Self Care | Payer: Medicaid Other | Attending: Emergency Medicine | Admitting: Emergency Medicine

## 2018-01-12 ENCOUNTER — Other Ambulatory Visit: Payer: Self-pay

## 2018-01-12 DIAGNOSIS — Y9289 Other specified places as the place of occurrence of the external cause: Secondary | ICD-10-CM | POA: Diagnosis not present

## 2018-01-12 DIAGNOSIS — S41141A Puncture wound with foreign body of right upper arm, initial encounter: Secondary | ICD-10-CM | POA: Diagnosis present

## 2018-01-12 DIAGNOSIS — Y9389 Activity, other specified: Secondary | ICD-10-CM | POA: Insufficient documentation

## 2018-01-12 DIAGNOSIS — Y999 Unspecified external cause status: Secondary | ICD-10-CM | POA: Insufficient documentation

## 2018-01-12 DIAGNOSIS — W458XXA Other foreign body or object entering through skin, initial encounter: Secondary | ICD-10-CM | POA: Diagnosis not present

## 2018-01-12 MED ORDER — LIDOCAINE HCL 2 % IJ SOLN
INTRAMUSCULAR | Status: AC
  Filled 2018-01-12: qty 20

## 2018-01-12 NOTE — ED Triage Notes (Signed)
Pt presents with a fish hook in her R elbow. No distress.

## 2018-01-12 NOTE — ED Notes (Signed)
Bed: [facility]PT2 Expected date:  Expected time:  Means of arrival:  Comments: 

## 2018-01-12 NOTE — Discharge Instructions (Addendum)
Return here as needed. Keep the area clean and dry

## 2018-01-15 NOTE — ED Provider Notes (Signed)
[facility] DEPT Provider Note   CSN: [id number] Arrival date & time: 01/12/18  2204     History   Chief Complaint Chief Complaint  Patient presents with  . Foreign Body in Skin    Fishhook    HPI Chelsea Reed is a 32 y.o. female.  HPI Patient presents to the emergency department with a fishhook in the right upper portion of her arm posteriorly.  The patient states that the fishhook when she tripped over a fishing pole.  The patient states that movement and palpation make the pain worse to the area.  The patient states she did not take any medications prior to arrival for symptoms.  Past Medical History:  Diagnosis Date  . Anemia   . Bilateral ovarian cysts    resolve without intervention  . Eczema   . Heavy periods   . Migraine   . Trichomonas   . Urinary tract infection     Patient Active Problem List   Diagnosis Date Noted  . History of trichomoniasis 11/15/2015  . Vaginal discharge 11/15/2015    Past Surgical History:  Procedure Laterality Date  . INDUCED ABORTION     TABs x 5  . TOOTH EXTRACTION       OB History    Gravida  6   Para  1   Term  1   Preterm  0   AB  5   Living  1     SAB  0   TAB  5   Ectopic  0   Multiple  0   Live Births               Home Medications    Prior to Admission medications   Medication Sig Start Date End Date Taking? Authorizing Provider  benzonatate (TESSALON) 100 MG capsule Take 1 capsule (100 mg total) by mouth every 8 (eight) hours. 10/22/17   Varney Biles, MD  cephALEXin (KEFLEX) 500 MG capsule Take 1 capsule (500 mg total) by mouth 2 (two) times daily. Patient not taking: Reported on 11/15/2015 02/15/15   Horton, Barbette Hair, MD  doxycycline (VIBRAMYCIN) 100 MG capsule Take 1 capsule (100 mg total) by mouth 2 (two) times daily. Take with a full glass of water/milk Patient not taking: Reported on 01/23/2015 01/15/15   Antonietta Breach, PA-C  ibuprofen (ADVIL,MOTRIN)  600 MG tablet Take 1 tablet (600 mg total) by mouth once. Patient not taking: Reported on 11/15/2015 12/20/14   Gwen Pounds, CNM  loperamide (IMODIUM) 2 MG capsule Take 1 capsule (2 mg total) by mouth 4 (four) times daily as needed for diarrhea or loose stools. 11/03/17   Charlesetta Shanks, MD  metroNIDAZOLE (FLAGYL) 500 MG tablet Take 1 tablet (500 mg total) by mouth 2 (two) times daily. 11/15/15   Aletha Halim, MD  naproxen (NAPROSYN) 500 MG tablet Take 1 tablet (500 mg total) by mouth 2 (two) times daily with a meal. 10/22/17   Varney Biles, MD  ondansetron (ZOFRAN ODT) 4 MG disintegrating tablet 4mg  ODT q4 hours prn nausea/vomit Patient not taking: Reported on 01/23/2015 12/23/14   Debby Freiberg, MD  penicillin v potassium (VEETID) 500 MG tablet Take 1 tablet (500 mg total) by mouth 4 (four) times daily. Patient not taking: Reported on 11/15/2015 06/10/15   Waynetta Pean, PA-C  phenazopyridine (PYRIDIUM) 200 MG tablet Take 1 tablet (200 mg total) by mouth 3 (three) times daily as needed for pain. Patient not taking: Reported on 02/15/2015  01/24/15   Larene Pickett, PA-C  traMADol (ULTRAM) 50 MG tablet Take 1 tablet (50 mg total) by mouth every 6 (six) hours as needed. Patient not taking: Reported on 01/23/2015 01/15/15   Antonietta Breach, PA-C    Family History Family History  Problem Relation Age of Onset  . Hypertension Other   . Diabetes Other     Social History Social History   Tobacco Use  . Smoking status: Never Smoker  . Smokeless tobacco: Never Used  Substance Use Topics  . Alcohol use: Yes    Comment: social drinker  . Drug use: No     Allergies   Patient has no known allergies.   Review of Systems Review of Systems  All other systems negative except as documented in the HPI. All pertinent positives and negatives as reviewed in the HPI. Physical Exam Updated Vital Signs BP (!) 156/89 (BP Location: Right Arm)   Pulse 67   Temp 98.7 F (37.1 C) (Oral)   Resp  16   SpO2 100%   Physical Exam  Constitutional: She is oriented to person, place, and time. She appears well-developed and well-nourished. No distress.  HENT:  Head: Normocephalic and atraumatic.  Eyes: Pupils are equal, round, and reactive to light.  Pulmonary/Chest: Effort normal.  Musculoskeletal:       Arms: Neurological: She is alert and oriented to person, place, and time.  Skin: Skin is warm and dry.  Psychiatric: She has a normal mood and affect.  Nursing note and vitals reviewed.    ED Treatments / Results  Labs (all labs ordered are listed, but only abnormal results are displayed) Labs Reviewed - No data to display  EKG None  Radiology No results found.  Procedures .Foreign Body Removal Date/Time: 01/15/2018 6:24 AM Performed by: Dalia Heading, PA-C Authorized by: Dalia Heading, PA-C  Consent: Verbal consent obtained. Risks and benefits: risks, benefits and alternatives were discussed Consent given by: patient Patient identity confirmed: verbally with patient Body area: skin General location: upper extremity Location details: right upper arm Anesthesia: local infiltration  Anesthesia: Local Anesthetic: lidocaine 2% without epinephrine  Sedation: Patient sedated: no  Patient restrained: no Localization method: visualized Removal mechanism: hemostat Dressing: dressing applied Tendon involvement: none Depth: deep Complexity: simple   (including critical care time)  Medications Ordered in ED Medications - No data to display   Initial Impression / Assessment and Plan / ED Course  I have reviewed the triage vital signs and the nursing notes.  Pertinent labs & imaging results that were available during my care of the patient were reviewed by me and considered in my medical decision making (see chart for details).    The fishhook was removed from the patient's right upper arm.  The patient is advised to return here as needed.  Told  area clean and dry.  Final Clinical Impressions(s) / ED Diagnoses   Final diagnoses:  Fishing hook foreign body, initial encounter    ED Discharge Orders    None       Dalia Heading, PA-C 01/15/18 3825    Fredia Sorrow, MD 01/16/18 1257

## 2019-07-21 ENCOUNTER — Ambulatory Visit: Payer: 59

## 2020-09-15 DIAGNOSIS — O3411 Maternal care for benign tumor of corpus uteri, first trimester: Secondary | ICD-10-CM | POA: Insufficient documentation

## 2020-10-05 DIAGNOSIS — Z6833 Body mass index (BMI) 33.0-33.9, adult: Secondary | ICD-10-CM | POA: Insufficient documentation

## 2021-01-27 DIAGNOSIS — Z3009 Encounter for other general counseling and advice on contraception: Secondary | ICD-10-CM | POA: Insufficient documentation

## 2021-02-18 DIAGNOSIS — O42011 Preterm premature rupture of membranes, onset of labor within 24 hours of rupture, first trimester: Secondary | ICD-10-CM

## 2021-03-29 DIAGNOSIS — Z87898 Personal history of other specified conditions: Secondary | ICD-10-CM | POA: Insufficient documentation

## 2021-03-29 DIAGNOSIS — T753XXA Motion sickness, initial encounter: Secondary | ICD-10-CM | POA: Insufficient documentation

## 2022-05-16 ENCOUNTER — Inpatient Hospital Stay (HOSPITAL_COMMUNITY): Payer: Medicaid Other

## 2022-05-16 ENCOUNTER — Inpatient Hospital Stay (HOSPITAL_COMMUNITY)
Admission: AD | Admit: 2022-05-16 | Discharge: 2022-05-16 | Disposition: A | Discharge status: Home / Self Care | Payer: Medicaid Other | Attending: Family Medicine | Admitting: Family Medicine

## 2022-05-16 ENCOUNTER — Encounter (HOSPITAL_COMMUNITY): Payer: Self-pay | Admitting: Family Medicine

## 2022-05-16 DIAGNOSIS — O99891 Other specified diseases and conditions complicating pregnancy: Secondary | ICD-10-CM

## 2022-05-16 DIAGNOSIS — D259 Leiomyoma of uterus, unspecified: Secondary | ICD-10-CM | POA: Insufficient documentation

## 2022-05-16 DIAGNOSIS — O3680X0 Pregnancy with inconclusive fetal viability, not applicable or unspecified: Secondary | ICD-10-CM | POA: Diagnosis not present

## 2022-05-16 DIAGNOSIS — O3411 Maternal care for benign tumor of corpus uteri, first trimester: Secondary | ICD-10-CM | POA: Insufficient documentation

## 2022-05-16 DIAGNOSIS — R82998 Other abnormal findings in urine: Secondary | ICD-10-CM

## 2022-05-16 DIAGNOSIS — R102 Pelvic and perineal pain: Secondary | ICD-10-CM | POA: Diagnosis present

## 2022-05-16 DIAGNOSIS — M549 Dorsalgia, unspecified: Secondary | ICD-10-CM

## 2022-05-16 DIAGNOSIS — O26891 Other specified pregnancy related conditions, first trimester: Secondary | ICD-10-CM | POA: Insufficient documentation

## 2022-05-16 DIAGNOSIS — Z3A01 Less than 8 weeks gestation of pregnancy: Secondary | ICD-10-CM | POA: Diagnosis not present

## 2022-05-16 LAB — CBC
HCT: 29.9 % — ABNORMAL LOW (ref 36.0–46.0)
Hemoglobin: 9.5 g/dL — ABNORMAL LOW (ref 12.0–15.0)
MCH: 24.5 pg — ABNORMAL LOW (ref 26.0–34.0)
MCHC: 31.8 g/dL (ref 30.0–36.0)
MCV: 77.1 fL — ABNORMAL LOW (ref 80.0–100.0)
Platelets: 281 10*3/uL (ref 150–400)
RBC: 3.88 MIL/uL (ref 3.87–5.11)
RDW: 19 % — ABNORMAL HIGH (ref 11.5–15.5)
WBC: 6.3 10*3/uL (ref 4.0–10.5)
nRBC: 0 % (ref 0.0–0.2)

## 2022-05-16 LAB — ABO/RH: ABO/RH(D): O POS

## 2022-05-16 LAB — COMPREHENSIVE METABOLIC PANEL
ALT: 13 U/L (ref 0–44)
AST: 17 U/L (ref 15–41)
Albumin: 3.5 g/dL (ref 3.5–5.0)
Alkaline Phosphatase: 59 U/L (ref 38–126)
Anion gap: 9 (ref 5–15)
BUN: 8 mg/dL (ref 6–20)
CO2: 22 mmol/L (ref 22–32)
Calcium: 8.6 mg/dL — ABNORMAL LOW (ref 8.9–10.3)
Chloride: 105 mmol/L (ref 98–111)
Creatinine, Ser: 0.83 mg/dL (ref 0.44–1.00)
GFR, Estimated: >60 mL/min (ref >60)
Glucose, Bld: 103 mg/dL — ABNORMAL HIGH (ref 70–99)
Potassium: 3.3 mmol/L — ABNORMAL LOW (ref 3.5–5.1)
Sodium: 136 mmol/L (ref 135–145)
Total Bilirubin: 0.1 mg/dL — ABNORMAL LOW (ref 0.3–1.2)
Total Protein: 6.4 g/dL — ABNORMAL LOW (ref 6.5–8.1)

## 2022-05-16 LAB — HIV ANTIBODY (ROUTINE TESTING W REFLEX): HIV Screen 4th Generation wRfx: NONREACTIVE

## 2022-05-16 LAB — URINALYSIS, ROUTINE W REFLEX MICROSCOPIC
Bilirubin Urine: NEGATIVE
Glucose, UA: NEGATIVE mg/dL
Hgb urine dipstick: NEGATIVE
Ketones, ur: NEGATIVE mg/dL
Nitrite: NEGATIVE
Protein, ur: NEGATIVE mg/dL
Specific Gravity, Urine: 1.023 (ref 1.005–1.030)
pH: 5 (ref 5.0–8.0)

## 2022-05-16 LAB — WET PREP, GENITAL
Clue Cells Wet Prep HPF POC: NONE SEEN
Sperm: NONE SEEN
Trich, Wet Prep: NONE SEEN
WBC, Wet Prep HPF POC: >=10 — AB (ref <10)
Yeast Wet Prep HPF POC: NONE SEEN

## 2022-05-16 LAB — HCG, QUANTITATIVE, PREGNANCY: hCG, Beta Chain, Quant, S: 15095 m[IU]/mL — ABNORMAL HIGH (ref <5)

## 2022-05-16 LAB — POCT PREGNANCY, URINE: Preg Test, Ur: POSITIVE — AB

## 2022-05-16 NOTE — MAU Note (Signed)
.  Chelsea Reed is a 37 y.o. at Unknown here in MAU reporting positive upt January 6th. Has intermittent periods of heart racing and feels light-headed at times, RLQ pain and lower back pain. Just feels different from  previous pregnancies. [facility] and encouraged to be seen to r/o ectopic. States has regular menstrual cycles. No vag bleeding. Currently only has pain in lower back and no other complaints. LMP: 12/10 Onset of complaint: early January Pain score: 4 Vitals:   05/16/22 1940 05/16/22 1942  BP:  (!) 140/79  Pulse: 79   Resp: 17   Temp: 98.8 F (37.1 C)   SpO2: 100%      FHT:n/a Lab orders placed from triage:  upt, u/a

## 2022-05-16 NOTE — MAU Provider Note (Addendum)
Chief Complaint: Possible Pregnancy and Back Pain   None       SUBJECTIVE HPI: Chelsea Reed is a 37 y.o. [id number] at 45w2dby LMP who presents to maternity admissions reporting ***. She denies vaginal bleeding, vaginal itching/burning, urinary symptoms, h/a, dizziness, n/v, or fever/chills.     HPI  Past Medical History:  Diagnosis Date   Anemia    Bilateral ovarian cysts    resolve without intervention   Eczema    Heavy periods    Migraine    Trichomonas    Urinary tract infection    Past Surgical History:  Procedure Laterality Date   INDUCED ABORTION     TABs x 5   TOOTH EXTRACTION     Social History   Socioeconomic History   Marital status: Single    Spouse name: Not on file   Number of children: Not on file   Years of education: Not on file   Highest education level: Not on file  Occupational History   Not on file  Tobacco Use   Smoking status: Never   Smokeless tobacco: Never  Substance and Sexual Activity   Alcohol use: Yes    Comment: social drinker   Drug use: No   Sexual activity: Yes    Birth control/protection: None  Other Topics Concern   Not on file  Social History Narrative   Not on file   Social Determinants of Health   Financial Resource Strain: Not on file  Food Insecurity: Not on file  Transportation Needs: Not on file  Physical Activity: Not on file  Stress: Not on file  Social Connections: Not on file  Intimate Partner Violence: Not on file   No current facility-administered medications on file prior to encounter.   No current outpatient medications on file prior to encounter.   No Known Allergies  I have reviewed patient's Past Medical Hx, Surgical Hx, Family Hx, Social Hx, medications and allergies.   ROS:  Review of Systems Review of Systems  Other systems negative   Physical Exam  Physical Exam Patient Vitals for the past 24 hrs:  BP Temp Pulse Resp SpO2 Height Weight  05/16/22 2300 (!) 122/92 -- 79 -- -- -- --   05/16/22 1942 (!) 140/79 -- -- -- -- -- --  05/16/22 1940 -- 98.8 F (37.1 C) 79 17 100 % '5\' 5"'$  (1.651 m) 90.3 kg   Constitutional: Well-developed, well-nourished female in no acute distress.  Cardiovascular: normal rate Respiratory: normal effort GI: Abd soft, non-tender. Pos BS x 4 MS: Extremities nontender, no edema, normal ROM Neurologic: Alert and oriented x 4.  GU: Neg CVAT.  PELVIC EXAM: Cervix pink, visually closed, without lesion, scant white creamy discharge, vaginal walls and external genitalia normal Bimanual exam: Cervix 0/long/high, firm, anterior, neg CMT, uterus nontender, nonenlarged, adnexa without tenderness, enlargement, or mass  FHT *** by doppler  LAB RESULTS Results for orders placed or performed during the hospital encounter of 05/16/22 (from the past 24 hour(s))  Urinalysis, Routine w reflex microscopic Urine, Clean Catch     Status: Abnormal   Collection Time: 05/16/22  7:55 PM  Result Value Ref Range   Color, Urine YELLOW YELLOW   APPearance HAZY (A) CLEAR   Specific Gravity, Urine 1.023 1.005 - 1.030   pH 5.0 5.0 - 8.0   Glucose, UA NEGATIVE NEGATIVE mg/dL   Hgb urine dipstick NEGATIVE NEGATIVE   Bilirubin Urine NEGATIVE NEGATIVE   Ketones, ur NEGATIVE NEGATIVE mg/dL  Protein, ur NEGATIVE NEGATIVE mg/dL   Nitrite NEGATIVE NEGATIVE   Leukocytes,Ua LARGE (A) NEGATIVE   RBC / HPF 0-5 0 - 5 RBC/hpf   WBC, UA 21-50 0 - 5 WBC/hpf   Bacteria, UA FEW (A) NONE SEEN   Squamous Epithelial / HPF 0-5 0 - 5 /HPF   Mucus PRESENT   Pregnancy, urine POC     Status: Abnormal   Collection Time: 05/16/22  8:09 PM  Result Value Ref Range   Preg Test, Ur POSITIVE (A) NEGATIVE  Wet prep, genital     Status: Abnormal   Collection Time: 05/16/22  8:35 PM   Specimen: Vaginal  Result Value Ref Range   Yeast Wet Prep HPF POC NONE SEEN NONE SEEN   Trich, Wet Prep NONE SEEN NONE SEEN   Clue Cells Wet Prep HPF POC NONE SEEN NONE SEEN   WBC, Wet Prep HPF POC >=10 (A)  <10   Sperm NONE SEEN   ABO/Rh     Status: None   Collection Time: 05/16/22  9:01 PM  Result Value Ref Range   ABO/RH(D) O POS    No rh immune globuloin      NOT A RH IMMUNE GLOBULIN CANDIDATE, PT RH POSITIVE Performed at [facility], [address] [place], [place] [address]   CBC     Status: Abnormal   Collection Time: 05/16/22  9:04 PM  Result Value Ref Range   WBC 6.3 4.0 - 10.5 K/uL   RBC 3.88 3.87 - 5.11 MIL/uL   Hemoglobin 9.5 (L) 12.0 - 15.0 g/dL   HCT 29.9 (L) 36.0 - 46.0 %   MCV 77.1 (L) 80.0 - 100.0 fL   MCH 24.5 (L) 26.0 - 34.0 pg   MCHC 31.8 30.0 - 36.0 g/dL   RDW 19.0 (H) 11.5 - 15.5 %   Platelets 281 150 - 400 K/uL   nRBC 0.0 0.0 - 0.2 %  Comprehensive metabolic panel     Status: Abnormal   Collection Time: 05/16/22  9:04 PM  Result Value Ref Range   Sodium 136 135 - 145 mmol/L   Potassium 3.3 (L) 3.5 - 5.1 mmol/L   Chloride 105 98 - 111 mmol/L   CO2 22 22 - 32 mmol/L   Glucose, Bld 103 (H) 70 - 99 mg/dL   BUN 8 6 - 20 mg/dL   Creatinine, Ser 0.83 0.44 - 1.00 mg/dL   Calcium 8.6 (L) 8.9 - 10.3 mg/dL   Total Protein 6.4 (L) 6.5 - 8.1 g/dL   Albumin 3.5 3.5 - 5.0 g/dL   AST 17 15 - 41 U/L   ALT 13 0 - 44 U/L   Alkaline Phosphatase 59 38 - 126 U/L   Total Bilirubin 0.1 (L) 0.3 - 1.2 mg/dL   GFR, Estimated >60 >60 mL/min   Anion gap 9 5 - 15  HIV Antibody (routine testing w rflx)     Status: None   Collection Time: 05/16/22  9:04 PM  Result Value Ref Range   HIV Screen 4th Generation wRfx Non Reactive Non Reactive  hCG, quantitative, pregnancy     Status: Abnormal   Collection Time: 05/16/22  9:04 PM  Result Value Ref Range   hCG, Beta Chain, Quant, S 15,095 (H) <5 mIU/mL    --/--/O POS (01/23 2101)  IMAGING US OB LESS THAN 14 WEEKS WITH OB TRANSVAGINAL  Result Date: 05/16/2022 CLINICAL DATA:  Pelvic pain pregnant patient EXAM: OBSTETRIC <14 WK [place] AND TRANSVAGINAL  OB US TECHNIQUE: Both transabdominal and transvaginal ultrasound examinations  were performed for complete evaluation of the gestation as well as the maternal uterus, adnexal regions, and pelvic cul-de-sac. Transvaginal technique was performed to assess early pregnancy. COMPARISON:  None Available. FINDINGS: Intrauterine gestational sac: Single intrauterine gestation Yolk sac:  Visualized Embryo:  Visualized Cardiac Activity: Visualized Heart Rate: 125 bpm CRL:  4 mm   6 w   1 d                  [place] EDC: 01/08/2023 Subchorionic hemorrhage:  None visualized. Maternal uterus/adnexae: Ovaries are within normal limits. Right ovary measures 1 by 2 x 1.4 cm. Left ovary measures 3 x 1.8 x 2.3 cm and contains corpus luteum. Multiple uterine fibroids, the largest is seen at the posterior uterine corpus, subserosal and measures 4.5 x 3.4 x 4.5 cm. IMPRESSION: 1. Single viable intrauterine pregnancy as above. 2. Multiple uterine fibroids Electronically Signed   By: Donavan Foil M.D.   On: 05/16/2022 22:07    MAU Management/MDM: I have reviewed the triage vital signs and the nursing notes.   Pertinent labs & imaging results that were available during my care of the patient were reviewed by me and considered in my medical decision making (see chart for details).      I have reviewed her medical records including past results, notes and treatments. Medical, Surgical, and family history were reviewed.  Medications and recent lab tests were reviewed  Ordered usual first trimester r/o ectopic labs.   Pelvic exam and cultures done Will check baseline Ultrasound to rule out ectopic.  Consult *** with presentation, exam findings, and results.   Treatments in MAU included ***.   This bleeding/pain can represent a normal pregnancy with bleeding, spontaneous abortion or even an ectopic which can be life-threatening.  The process as listed above helps to determine which of these is present.    ASSESSMENT 1. Back pain affecting pregnancy in first trimester   2. Pregnancy of unknown anatomic  location   3. Leukocytes in urine   4. [redacted] weeks gestation of pregnancy     PLAN Discharge home Plan to repeat HCG level in 48 hours in clinic per 11:00 am schedule Will repeat  Ultrasound in about 7-10 days if HCG levels double appropriately  Ectopic precautions   Pt stable at time of discharge. Encouraged to return here if she develops worsening of symptoms, increase in pain, fever, or other concerning symptoms.    Hansel Feinstein CNM, MSN Certified Nurse-Midwife 05/16/2022  11:35 PM

## 2022-05-16 NOTE — Discharge Instructions (Signed)
[place] for [employer] at [facility]             [address] [place], [place] [address] [place] for [facility] [place], [place], [place], [place], [address] [phone]  [facility] for [facility] at [facility], [place], [place], [place], [address] [phone]  Center for [facility] at [facility] [address] [place], [place], [place], [address] [phone]  Center for [facility] at [facility]                                 [place], [place], [place], [address] [phone]  Center for [facility]                                    [address] [place], [place], [address] [place] for [place] at [facility] [address], [place], [place], [place], [address]                              [facility] [place], [place], [place], [place], [address] [phone]  [facility]         Phone: [phone]  [facility]      Phone: [phone]   [facility]      Phone: [place] [facility]         Phone: [phone]   [place] Department-Maternity    Phone: [place]      Phone: [phone]  [facility]     Phone: [phone]  [facility]        Phone: [phone]  [facility] [place]) [phone]  [facility]      Phone: [phone]

## 2022-05-17 LAB — GC/CHLAMYDIA PROBE AMP (~~LOC~~) NOT AT ARMC
Chlamydia: NEGATIVE
Comment: NEGATIVE
Comment: NORMAL
Neisseria Gonorrhea: NEGATIVE

## 2022-05-18 LAB — CULTURE, OB URINE

## 2022-06-06 ENCOUNTER — Telehealth: Payer: Self-pay

## 2022-06-06 NOTE — Telephone Encounter (Signed)
Patient called and states she is pretty sure she is miscarrying. Patient states that bleeding is manageable currently. Patient cautioned that if she has fever or bleeding through heavy pad an hour she needs to go back to [facility]U. Patient states understanding.  Changed upcoming appt to SAB follow up and patient desires to have birth control at this appt. Kathrene Alu RN

## 2022-06-20 ENCOUNTER — Encounter: Payer: Self-pay | Admitting: Advanced Practice Midwife

## 2022-06-20 ENCOUNTER — Other Ambulatory Visit (HOSPITAL_COMMUNITY)
Admission: RE | Admit: 2022-06-20 | Discharge: 2022-06-20 | Disposition: A | Discharge status: Home / Self Care | Payer: Medicaid Other | Source: Ambulatory Visit | Attending: Advanced Practice Midwife | Admitting: Advanced Practice Midwife

## 2022-06-20 ENCOUNTER — Ambulatory Visit: Payer: Medicaid Other | Admitting: Advanced Practice Midwife

## 2022-06-20 VITALS — BP 126/71 | HR 67 | Ht 64.0 in | Wt 198.0 lb

## 2022-06-20 DIAGNOSIS — Z0000 Encounter for general adult medical examination without abnormal findings: Secondary | ICD-10-CM | POA: Diagnosis present

## 2022-06-20 DIAGNOSIS — O039 Complete or unspecified spontaneous abortion without complication: Secondary | ICD-10-CM | POA: Diagnosis not present

## 2022-06-20 DIAGNOSIS — Z30011 Encounter for initial prescription of contraceptive pills: Secondary | ICD-10-CM | POA: Diagnosis not present

## 2022-06-20 MED ORDER — NORETHINDRONE 0.35 MG PO TABS: 1.0000 | ORAL_TABLET | Freq: Every day | ORAL | 11 refills | Status: AC

## 2022-06-20 NOTE — Addendum Note (Signed)
Addended by: Flonnie Hailstone on: 06/20/2022 11:29 AM   Modules accepted: Orders

## 2022-06-20 NOTE — Progress Notes (Addendum)
GYNECOLOGY ENCOUNTER NOTE  Subjective:   Chelsea Reed is a 37 y.o. [phone] female here for followup status post Spontaneous abortion on 06/06/22   She was seen in [facility]U by me on 05/17/23 and found to be [redacted] weeks pregnant with a live fetus.  Current complaints: desires to start Mini Pill for contraception.  Formerly using Calendar and Withdrawal methods.   Denies abnormal vaginal bleeding, discharge, pelvic pain, problems with intercourse or other gynecologic concerns.    OCPs cause migraines,  did have them as a teen with MiniPill also but wants to try again  Gynecologic History Patient's last menstrual period was 04/02/2022. Contraception: oral progesterone-only contraceptive Last Pap: 05/27/19. Results were: normal   Obstetric History OB History  Gravida Para Term Preterm AB Living  '8 2 1 1 6 1  '$ SAB IAB Ectopic Multiple Live Births  1 5 0 0 1    # Outcome Date GA Lbr Len/2nd Weight Sex Delivery Anes PTL Lv  8 SAB 06/06/22 [redacted]w[redacted]d        7 Preterm 02/18/21 360w0d  Vag-Spont   LIV     Complications: Preterm premature rupture of membranes (PPROM) with onset of labor within 24 hours of rupture in first trimester, antepartum  6 IAB           5 IAB           4 IAB           3 IAB           2 IAB           1 Term      Vag-Spont       Past Medical History:  Diagnosis Date   Anemia    Bilateral ovarian cysts    resolve without intervention   Eczema    Heavy periods    Migraine    Trichomonas    Urinary tract infection     Past Surgical History:  Procedure Laterality Date   INDUCED ABORTION     TABs x 5   TOOTH EXTRACTION      No current outpatient medications on file prior to visit.   No current facility-administered medications on file prior to visit.    No Known Allergies  Social History   Socioeconomic History   Marital status: Single    Spouse name: Not on file   Number of children: Not on file   Years of education: Not on file   Highest education level: Not  on file  Occupational History   Not on file  Tobacco Use   Smoking status: Never   Smokeless tobacco: Never  Substance and Sexual Activity   Alcohol use: Yes    Comment: social drinker   Drug use: No   Sexual activity: Yes    Birth control/protection: None  Other Topics Concern   Not on file  Social History Narrative   Not on file   Social Determinants of Health   Financial Resource Strain: Not on file  Food Insecurity: Not on file  Transportation Needs: Not on file  Physical Activity: Not on file  Stress: Not on file  Social Connections: Not on file  Intimate Partner Violence: Not on file    Family History  Problem Relation Age of Onset   Hypertension Other    Diabetes Other     The following portions of the patient's history were reviewed and updated as appropriate: allergies, current medications, past family history,  past medical history, past social history, past surgical history and problem list.  Review of Systems Pertinent items noted in HPI and remainder of comprehensive ROS otherwise negative.   Objective:  BP 126/71   Pulse 67   Ht '5\' 4"'$  (1.626 m)   Wt 198 lb (89.8 kg)   LMP 04/02/2022   Breastfeeding Unknown   BMI 33.99 kg/m  CONSTITUTIONAL: Well-developed, well-nourished female in no acute distress.   SKIN: Skin is warm and dry. No rash noted. Not diaphoretic. No erythema. No pallor. PSYCHIATRIC: Normal mood and affect. Normal behavior. Normal judgment and thought content. CARDIOVASCULAR: Normal heart rate noted RESPIRATORY:  Effort  normal, no problems with respiration noted. PELVIC: deferred  Assessment:  Status post spontaneous abortion  Desires Mini Pill for contraception   Plan:  Will follow up results of pap smear and manage accordingly. Will check HCG level to make sure it dropped, since we did not see her with SAB Pap done Discussed methods of contraception available if migraines come back with this Mini Pill.  May consider IUD or  BTL, when she turns 38 Routine preventative health maintenance measures emphasized. Please refer to After Visit Summary for other counseling recommendations.

## 2022-06-20 NOTE — Progress Notes (Signed)
Pt reports for SAB f/u. Pt reports pelvic and low back pain, no bleeding . Pt desires birth control pills.

## 2022-06-21 LAB — BETA HCG QUANT (REF LAB): hCG Quant: 20 m[IU]/mL

## 2022-06-22 LAB — CYTOLOGY - PAP
Adequacy: ABSENT
Comment: NEGATIVE
Diagnosis: NEGATIVE
High risk HPV: NEGATIVE

## 2023-02-17 ENCOUNTER — Other Ambulatory Visit: Payer: Self-pay

## 2023-02-17 ENCOUNTER — Emergency Department (HOSPITAL_COMMUNITY)
Admission: EM | Admit: 2023-02-17 | Discharge: 2023-02-17 | Disposition: A | Discharge status: Home / Self Care | Payer: Medicaid Other | Attending: Emergency Medicine | Admitting: Emergency Medicine

## 2023-02-17 DIAGNOSIS — R110 Nausea: Secondary | ICD-10-CM | POA: Insufficient documentation

## 2023-02-17 DIAGNOSIS — R42 Dizziness and giddiness: Secondary | ICD-10-CM | POA: Diagnosis not present

## 2023-02-17 DIAGNOSIS — R531 Weakness: Secondary | ICD-10-CM | POA: Diagnosis present

## 2023-02-17 NOTE — ED Triage Notes (Signed)
Patient is here due to weakness. She came via [facility]. She went out with her mom and had to call EMS to pick her up from the parking lot because she was feeling very weak. Was diagnosed with URI in the UC 3 days ago and was prescribed azithromycin and some pain medication.   BP 130/92 HR 72 O2 98%  CBG 106

## 2023-02-17 NOTE — ED Provider Notes (Signed)
[place] EMERGENCY DEPARTMENT AT [facility] Provider Note   CSN: [id number] Arrival date & time: 02/17/23  1150     History  No chief complaint on file.   Chelsea Reed is a 37 y.o. female.  37 year old female with prior medical history detailed below presents for evaluation.  Patient reports recent diagnosis with URI.  She reports that she is feeling better today and went out with her mother.  While shopping she became mildly nauseated.  This was followed by a brief episode of dizziness and wooziness.  Patient felt like she might pass out but did not.  She was able to drink some fluids and to color self off.  She felt much better after taking p.o. fluids.  In the ED she is without specific complaint.  She feels much better.  She is requesting a note for work.  The history is provided by the patient and medical records.       Home Medications Prior to Admission medications   Medication Sig Start Date End Date Taking? Authorizing Provider  norethindrone (MICRONOR) 0.35 MG tablet Take 1 tablet (0.35 mg total) by mouth daily. 06/20/22   Aviva Signs, CNM      Allergies    Patient has no known allergies.    Review of Systems   Review of Systems  All other systems reviewed and are negative.   Physical Exam Updated Vital Signs There were no vitals taken for this visit. Physical Exam Vitals and nursing note reviewed.  Constitutional:      General: She is not in acute distress.    Appearance: Normal appearance. She is well-developed.  HENT:     Head: Normocephalic and atraumatic.  Eyes:     Conjunctiva/sclera: Conjunctivae normal.     Pupils: Pupils are equal, round, and reactive to light.  Cardiovascular:     Rate and Rhythm: Normal rate and regular rhythm.     Heart sounds: Normal heart sounds.  Pulmonary:     Effort: Pulmonary effort is normal. No respiratory distress.     Breath sounds: Normal breath sounds.  Abdominal:     General: There  is no distension.     Palpations: Abdomen is soft.     Tenderness: There is no abdominal tenderness.  Musculoskeletal:        General: No deformity. Normal range of motion.     Cervical back: Normal range of motion and neck supple.  Skin:    General: Skin is warm and dry.  Neurological:     General: No focal deficit present.     Mental Status: She is alert and oriented to person, place, and time.     ED Results / Procedures / Treatments   Labs (all labs ordered are listed, but only abnormal results are displayed) Labs Reviewed - No data to display  EKG None  Radiology No results found.  Procedures Procedures    Medications Ordered in ED Medications - No data to display  ED Course/ Medical Decision Making/ A&P                                 Medical Decision Making   Medical Screen Complete  This patient presented to the ED with complaint of near syncope.  This complaint involves an extensive number of treatment options. The initial differential diagnosis includes, but is not limited to, metabolic abnormality,  vasovagal reaction, etc.  This presentation is: Acute, Self-Limited, Previously Undiagnosed, Uncertain Prognosis, Complicated, and Systemic Symptoms  Patient reports brief episode consistent with likely vasovagal reaction.  Patient is improved in the ED.  Patient was offered but declines IV, IV fluids, labs.  She reports that she feels much improved and desires discharge.  She is requesting a note for work.  Importance of close follow-up was stressed.  Strict return precautions given and understood.  Additional history obtained: External records from outside sources obtained and reviewed including prior ED visits and prior Inpatient records.   Problem List / ED Course:  Likely vasovagal episode   Reevaluation:  After the interventions noted above, I reevaluated the patient and found that they have: resolved   Disposition:  After consideration  of the diagnostic results and the patients response to treatment, I feel that the patent would benefit from close outpatient follow-up.          Final Clinical Impression(s) / ED Diagnoses Final diagnoses:  Weakness    Rx / DC Orders ED Discharge Orders     None         Wynetta Fines, MD 02/17/23 1439

## 2023-02-17 NOTE — Discharge Instructions (Signed)
Return for any problem.  ?

## 2023-10-02 ENCOUNTER — Ambulatory Visit
Admission: EM | Admit: 2023-10-02 | Discharge: 2023-10-02 | Disposition: A | Discharge status: Home / Self Care | Attending: Family Medicine | Admitting: Family Medicine

## 2023-10-02 DIAGNOSIS — N920 Excessive and frequent menstruation with regular cycle: Secondary | ICD-10-CM | POA: Diagnosis not present

## 2023-10-02 LAB — POCT URINE PREGNANCY: Preg Test, Ur: NEGATIVE

## 2023-10-02 NOTE — ED Triage Notes (Signed)
 Pt present with c/o heavy vaginal bleeding. States today at work she was passing large blood clots. Pt has been constantly changing her pads every hour. Denis abd cramping and pain.

## 2023-10-02 NOTE — Discharge Instructions (Signed)
 Monitor your symptoms closely and if you continue to go through 1 tampon or pad an hour over the next 2 hours please go to the emergency room for further evaluation of your symptoms.

## 2023-10-02 NOTE — ED Provider Notes (Signed)
 [facility]    CSN: [id number] Arrival date & time: 10/02/23  1811      History   Chief Complaint Chief Complaint  Patient presents with   Vaginal Bleeding    HPI Chelsea Reed is a 38 y.o. female with a past medical history of uterine fibroids presents for menorrhagia.  Patient reports she typically has heavier than normal periods due to her fibroids and sometimes passes clots.  Today she states she has been much heavier than normal and has been passing several large clots.  Reports over the past 4 hours she has been going through an entire tampon and pad every hour but thinks it is slowing down slightly.  Denies any abdominal pain or cramping.  Dysuria or back pain.  No other concerns at this time.   Vaginal Bleeding   Past Medical History:  Diagnosis Date   Anemia    Bilateral ovarian cysts    resolve without intervention   Eczema    Heavy periods    Migraine    Trichomonas    Urinary tract infection     Patient Active Problem List   Diagnosis Date Noted   Hx of vertigo 03/29/2021   Motion sickness 03/29/2021   Unwanted fertility 01/27/2021   BMI 33.0-33.9,adult 10/05/2020   Leiomyoma of uterus affecting pregnancy in first trimester 09/15/2020   Vaginal discharge 11/15/2015    Past Surgical History:  Procedure Laterality Date   INDUCED ABORTION     TABs x 5   TOOTH EXTRACTION      OB History     Gravida  8   Para  2   Term  1   Preterm  1   AB  6   Living  1      SAB  1   IAB  5   Ectopic  0   Multiple  0   Live Births  1            Home Medications    Prior to Admission medications   Medication Sig Start Date End Date Taking? Authorizing Provider  norethindrone  (MICRONOR ) 0.35 MG tablet Take 1 tablet (0.35 mg total) by mouth daily. 06/20/22   Harlee Lichtenstein, CNM    Family History Family History  Problem Relation Age of Onset   Hypertension Other    Diabetes Other     Social History Social History    Tobacco Use   Smoking status: Never   Smokeless tobacco: Never  Substance Use Topics   Alcohol use: Yes    Comment: social drinker   Drug use: No     Allergies   Patient has no known allergies.   Review of Systems Review of Systems  Genitourinary:  Positive for vaginal bleeding.     Physical Exam Triage Vital Signs ED Triage Vitals  Encounter Vitals Group     BP 10/02/23 1839 122/83     Systolic BP Percentile --      Diastolic BP Percentile --      Pulse Rate 10/02/23 1838 62     Resp 10/02/23 1838 17     Temp 10/02/23 1839 98.6 F (37 C)     Temp Source 10/02/23 1838 Oral     SpO2 10/02/23 1838 96 %     Weight --      Height --      Head Circumference --      Peak Flow --      Pain Score  10/02/23 1837 0     Pain Loc --      Pain Education --      Exclude from Growth Chart --    No data found.  Updated Vital Signs BP 122/83 (BP Location: Left Arm)   Pulse 62   Temp 98.6 F (37 C)   Resp 17   LMP 09/29/2023 (Exact Date)   SpO2 96%   Breastfeeding No   Visual Acuity Right Eye Distance:   Left Eye Distance:   Bilateral Distance:    Right Eye Near:   Left Eye Near:    Bilateral Near:     Physical Exam Vitals and nursing note reviewed.  Constitutional:      Appearance: Normal appearance.  HENT:     Head: Normocephalic and atraumatic.  Eyes:     Pupils: Pupils are equal, round, and reactive to light.  Cardiovascular:     Rate and Rhythm: Normal rate.  Pulmonary:     Effort: Pulmonary effort is normal.  Skin:    General: Skin is warm and dry.  Neurological:     General: No focal deficit present.     Mental Status: She is alert and oriented to person, place, and time.  Psychiatric:        Mood and Affect: Mood normal.        Behavior: Behavior normal.      UC Treatments / Results  Labs (all labs ordered are listed, but only abnormal results are displayed) Labs Reviewed  POCT URINE PREGNANCY    EKG   Radiology No results  found.  Procedures Procedures (including critical care time)  Medications Ordered in UC Medications - No data to display  Initial Impression / Assessment and Plan / UC Course  I have reviewed the triage vital signs and the nursing notes.  Pertinent labs & imaging results that were available during my care of the patient were reviewed by me and considered in my medical decision making (see chart for details).     Reviewed exam and symptoms with patient.  Negative hCG.  Discussed fibroids can cause menorrhagia.  Patient thinks it might be slowing down slightly.  Advised that if she continues to go through more than 1 pad or tampon an hour over the next 2 hours she is to go to the emergency room for further evaluation.  She is in agreement with plan.  Advised to follow-up with your gynecologist or PCP in 2 to 3 days for recheck. Final Clinical Impressions(s) / UC Diagnoses   Final diagnoses:  Menorrhagia with regular cycle     Discharge Instructions      Monitor your symptoms closely and if you continue to go through 1 tampon or pad an hour over the next 2 hours please go to the emergency room for further evaluation of your symptoms.  ED Prescriptions   None    PDMP not reviewed this encounter.   Alleen Arbour, NP 10/02/23 Chelsea Reed
